# Patient Record
Sex: Female | Born: 1980
Health system: Southern US, Community
[De-identification: ages and names within clinical notes are randomized; demographics above are authoritative.]

## PROBLEM LIST (undated history)

## (undated) DIAGNOSIS — G4733 Obstructive sleep apnea (adult) (pediatric): Secondary | ICD-10-CM

## (undated) DIAGNOSIS — T7840XA Allergy, unspecified, initial encounter: Secondary | ICD-10-CM

## (undated) DIAGNOSIS — R112 Nausea with vomiting, unspecified: Secondary | ICD-10-CM

## (undated) DIAGNOSIS — Z9989 Dependence on other enabling machines and devices: Secondary | ICD-10-CM

## (undated) DIAGNOSIS — E282 Polycystic ovarian syndrome: Secondary | ICD-10-CM

## (undated) DIAGNOSIS — Z8669 Personal history of other diseases of the nervous system and sense organs: Secondary | ICD-10-CM

## (undated) DIAGNOSIS — Z9889 Other specified postprocedural states: Secondary | ICD-10-CM

## (undated) DIAGNOSIS — R7303 Prediabetes: Secondary | ICD-10-CM

## (undated) DIAGNOSIS — I10 Essential (primary) hypertension: Secondary | ICD-10-CM

## (undated) DIAGNOSIS — R011 Cardiac murmur, unspecified: Secondary | ICD-10-CM

## (undated) DIAGNOSIS — L509 Urticaria, unspecified: Secondary | ICD-10-CM

## (undated) DIAGNOSIS — I209 Angina pectoris, unspecified: Secondary | ICD-10-CM

## (undated) DIAGNOSIS — T8859XA Other complications of anesthesia, initial encounter: Secondary | ICD-10-CM

## (undated) DIAGNOSIS — R519 Headache, unspecified: Secondary | ICD-10-CM

## (undated) HISTORY — DX: Personal history of other diseases of the nervous system and sense organs: Z86.69

## (undated) HISTORY — DX: Allergy, unspecified, initial encounter: T78.40XA

## (undated) HISTORY — DX: Polycystic ovarian syndrome: E28.2

## (undated) HISTORY — PX: UVULOPALATOPHARYNGOPLASTY: SHX827

## (undated) HISTORY — DX: Obstructive sleep apnea (adult) (pediatric): Z99.89

## (undated) HISTORY — PX: TONSILLECTOMY: SUR1361

## (undated) HISTORY — DX: Obstructive sleep apnea (adult) (pediatric): G47.33

## (undated) HISTORY — DX: Essential (primary) hypertension: I10

## (undated) HISTORY — DX: Urticaria, unspecified: L50.9

## (undated) HISTORY — DX: Prediabetes: R73.03

---

## 2010-10-26 ENCOUNTER — Emergency Department (HOSPITAL_BASED_OUTPATIENT_CLINIC_OR_DEPARTMENT_OTHER)
Admission: EM | Admit: 2010-10-26 | Discharge: 2010-10-26 | Disposition: A | Discharge status: Home / Self Care | Payer: Self-pay | Attending: Emergency Medicine | Admitting: Emergency Medicine

## 2010-10-26 ENCOUNTER — Encounter: Payer: Self-pay | Admitting: Emergency Medicine

## 2010-10-26 DIAGNOSIS — R1032 Left lower quadrant pain: Secondary | ICD-10-CM | POA: Insufficient documentation

## 2010-10-26 DIAGNOSIS — K219 Gastro-esophageal reflux disease without esophagitis: Secondary | ICD-10-CM | POA: Insufficient documentation

## 2010-10-26 MED ORDER — FAMOTIDINE 20 MG PO TABS
40.0000 mg | ORAL_TABLET | Freq: Once | ORAL | Status: AC
  Administered 2010-10-26: 40 mg via ORAL
  Filled 2010-10-26: qty 2

## 2010-10-26 MED ORDER — GI COCKTAIL ~~LOC~~
30.0000 mL | Freq: Once | ORAL | Status: AC
  Administered 2010-10-26: 30 mL via ORAL
  Filled 2010-10-26: qty 30

## 2010-10-26 MED ORDER — FAMOTIDINE 20 MG PO TABS
ORAL_TABLET | ORAL | Status: AC
  Filled 2010-10-26: qty 1

## 2010-10-26 MED ORDER — SUCRALFATE 1 G PO TABS: 1.0000 g | ORAL_TABLET | Freq: Four times a day (QID) | ORAL | Status: DC

## 2010-10-26 NOTE — ED Notes (Signed)
Pt c/o LUQ abd pain with nausea after eating fried chicken tonight. Pt reports previous hx of "gallbladder problems". Gallbladder remains intact.

## 2010-10-26 NOTE — ED Provider Notes (Signed)
History     Chief Complaint  Patient presents with  . Abdominal Pain   Patient is a 30 y.o. female presenting with abdominal pain. The history is provided by the patient.  Abdominal Pain The primary symptoms of the illness include abdominal pain. The primary symptoms of the illness do not include vaginal discharge or vaginal bleeding. The current episode started less than 1 hour ago. The onset of the illness was sudden. The problem has not changed since onset. The abdominal pain is located in the LUQ. The abdominal pain radiates to the epigastric region. The abdominal pain is exacerbated by fatty foods.  The illness is associated with awakening from sleep. The patient states that she believes she is currently not pregnant.   Pt ate fired chicken and had sx shortly afterwards, no emesis or fever No past medical history on file.  No past surgical history on file.  No family history on file.  History  Substance Use Topics  . Smoking status: Never Smoker   . Smokeless tobacco: Not on file  . Alcohol Use: No    OB History    Grav Para Term Preterm Abortions TAB SAB Ect Mult Living                  Review of Systems  Gastrointestinal: Positive for abdominal pain.  Genitourinary: Negative for vaginal bleeding and vaginal discharge.  All other systems reviewed and are negative.    Physical Exam  BP 138/78  Pulse 72  Temp(Src) 98.4 F (36.9 C) (Oral)  Resp 18  SpO2 100%  Physical Exam  Nursing note and vitals reviewed. Constitutional: She is oriented to person, place, and time. Vital signs are normal. She appears well-developed and well-nourished.  Non-toxic appearance.  HENT:  Head: Normocephalic and atraumatic.  Eyes: Conjunctivae are normal. Pupils are equal, round, and reactive to light.  Neck: Normal range of motion.  Cardiovascular: Normal rate.   Pulmonary/Chest: Effort normal.  Abdominal: She exhibits no distension. There is no splenomegaly. There is tenderness  in the left upper quadrant. There is no rigidity, no rebound and no guarding. No hernia.    Neurological: She is alert and oriented to person, place, and time.  Skin: Skin is warm and dry.  Psychiatric: She has a normal mood and affect.    ED Course  Procedures  MDM  Current sx c/w gerd, meds given    Pt feels better, will give gi referral  Toy Baker, MD 10/26/10 [phone]

## 2011-05-13 DIAGNOSIS — J309 Allergic rhinitis, unspecified: Secondary | ICD-10-CM | POA: Insufficient documentation

## 2011-05-13 DIAGNOSIS — J3089 Other allergic rhinitis: Secondary | ICD-10-CM | POA: Insufficient documentation

## 2011-07-25 ENCOUNTER — Encounter (HOSPITAL_BASED_OUTPATIENT_CLINIC_OR_DEPARTMENT_OTHER): Payer: Self-pay | Admitting: *Deleted

## 2011-07-25 ENCOUNTER — Emergency Department (HOSPITAL_BASED_OUTPATIENT_CLINIC_OR_DEPARTMENT_OTHER)
Admission: EM | Admit: 2011-07-25 | Discharge: 2011-07-25 | Disposition: A | Discharge status: Home / Self Care | Payer: BC Managed Care – PPO | Attending: Emergency Medicine | Admitting: Emergency Medicine

## 2011-07-25 DIAGNOSIS — R112 Nausea with vomiting, unspecified: Secondary | ICD-10-CM | POA: Insufficient documentation

## 2011-07-25 DIAGNOSIS — K529 Noninfective gastroenteritis and colitis, unspecified: Secondary | ICD-10-CM

## 2011-07-25 DIAGNOSIS — K5289 Other specified noninfective gastroenteritis and colitis: Secondary | ICD-10-CM | POA: Insufficient documentation

## 2011-07-25 DIAGNOSIS — R109 Unspecified abdominal pain: Secondary | ICD-10-CM | POA: Insufficient documentation

## 2011-07-25 DIAGNOSIS — J45909 Unspecified asthma, uncomplicated: Secondary | ICD-10-CM | POA: Insufficient documentation

## 2011-07-25 DIAGNOSIS — R197 Diarrhea, unspecified: Secondary | ICD-10-CM | POA: Insufficient documentation

## 2011-07-25 MED ORDER — ONDANSETRON 8 MG PO TBDP: 8.0000 mg | ORAL_TABLET | Freq: Three times a day (TID) | ORAL | Status: AC | PRN

## 2011-07-25 MED ORDER — DIPHENOXYLATE-ATROPINE 2.5-0.025 MG PO TABS: 1.0000 | ORAL_TABLET | Freq: Four times a day (QID) | ORAL | Status: AC | PRN

## 2011-07-25 MED ORDER — ONDANSETRON HCL 4 MG/2ML IJ SOLN
4.0000 mg | Freq: Once | INTRAMUSCULAR | Status: AC
  Administered 2011-07-25: 4 mg via INTRAVENOUS

## 2011-07-25 MED ORDER — SODIUM CHLORIDE 0.9 % IV BOLUS (SEPSIS): 1000.0000 mL | Freq: Once | INTRAVENOUS | Status: DC

## 2011-07-25 MED ORDER — PROMETHAZINE HCL 25 MG/ML IJ SOLN
25.0000 mg | Freq: Once | INTRAMUSCULAR | Status: AC
  Administered 2011-07-25: 25 mg via INTRAVENOUS
  Filled 2011-07-25: qty 1

## 2011-07-25 MED ORDER — ONDANSETRON HCL 4 MG/2ML IJ SOLN
INTRAMUSCULAR | Status: AC
  Filled 2011-07-25: qty 2

## 2011-07-25 MED ORDER — DIPHENOXYLATE-ATROPINE 2.5-0.025 MG PO TABS
2.0000 | ORAL_TABLET | Freq: Once | ORAL | Status: AC
Start: 2011-07-25 — End: 2011-07-25
  Administered 2011-07-25: 2 via ORAL
  Filled 2011-07-25: qty 2

## 2011-07-25 NOTE — ED Notes (Signed)
Pt reports that yesterday evening she began having abdominal pain, n/v and diarrhea.  Pt reports that she has had multiple episodes of emesis and diarrhea since eating dinner@10 .

## 2011-07-25 NOTE — ED Provider Notes (Signed)
History     CSN: [id number]  Arrival date & time 07/25/11  0137   None     Chief Complaint  Patient presents with  . Nausea, vomiting and diarrhea     (Consider location/radiation/quality/duration/timing/severity/associated sxs/prior treatment) HPI This is a 31 year old black female who's been having nausea, vomiting and diarrhea since about 10 PM yesterday evening. Symptoms are described as moderate to severe. She has had intermittent abdominal cramping. She was given IV fluids on arrival but has not been given anything for nausea. She states she has a dry mouth but denies lightheadedness or fever. She also has general malaise.  Past Medical History  Diagnosis Date  . Asthma     History reviewed. No pertinent past surgical history.  No family history on file.  History  Substance Use Topics  . Smoking status: Never Smoker   . Smokeless tobacco: Not on file  . Alcohol Use: No    OB History    Grav Para Term Preterm Abortions TAB SAB Ect Mult Living                  Review of Systems  All other systems reviewed and are negative.    Allergies  Clindamycin/lincomycin and Latex  Home Medications   Current Outpatient Rx  Name Route Sig Dispense Refill  . ESOMEPRAZOLE MAGNESIUM 10 MG PO PACK Oral Take 10 mg by mouth daily before breakfast.    . RANITIDINE HCL 150 MG PO TABS Oral Take 150 mg by mouth 2 (two) times daily.    . SUCRALFATE 1 G PO TABS Oral Take 1 tablet (1 g total) by mouth 4 (four) times daily. 30 tablet 0    BP 131/88  Pulse 100  Temp(Src) 98.2 F (36.8 C) (Oral)  Resp 16  Ht 5\' 2"  (1.575 m)  Wt 200 lb (90.719 kg)  BMI 36.58 kg/m2  SpO2 98%  LMP 06/24/2011  Physical Exam General: Well-developed, well-nourished female in no acute distress; appearance consistent with age of record HENT: normocephalic, atraumatic Eyes: pupils equal round and reactive to light; extraocular muscles intact Neck: supple Heart: regular rate and rhythm Lungs:  clear to auscultation bilaterally Abdomen: soft; nondistended; nontender; bowel sounds present Extremities: No deformity; full range of motion Neurologic: Awake, alert and oriented; motor function intact in all extremities and symmetric; no facial droop Skin: Warm and dry Psychiatric: Flat affect    ED Course  Procedures (including critical care time)    MDM  6:29 AM Drinking fluids without emesis. Given a 2 L normal saline bolus.          Hanley Seamen, MD 07/25/11 [phone]

## 2011-12-11 DIAGNOSIS — I10 Essential (primary) hypertension: Secondary | ICD-10-CM | POA: Insufficient documentation

## 2013-04-30 DIAGNOSIS — E282 Polycystic ovarian syndrome: Secondary | ICD-10-CM | POA: Insufficient documentation

## 2013-06-01 DIAGNOSIS — R9431 Abnormal electrocardiogram [ECG] [EKG]: Secondary | ICD-10-CM | POA: Insufficient documentation

## 2013-06-01 DIAGNOSIS — R002 Palpitations: Secondary | ICD-10-CM | POA: Insufficient documentation

## 2013-06-01 DIAGNOSIS — I119 Hypertensive heart disease without heart failure: Secondary | ICD-10-CM | POA: Insufficient documentation

## 2014-01-16 DIAGNOSIS — L219 Seborrheic dermatitis, unspecified: Secondary | ICD-10-CM | POA: Insufficient documentation

## 2015-04-24 DIAGNOSIS — J302 Other seasonal allergic rhinitis: Secondary | ICD-10-CM | POA: Insufficient documentation

## 2015-04-24 DIAGNOSIS — J45909 Unspecified asthma, uncomplicated: Secondary | ICD-10-CM | POA: Insufficient documentation

## 2015-06-27 DIAGNOSIS — M940 Chondrocostal junction syndrome [Tietze]: Secondary | ICD-10-CM | POA: Insufficient documentation

## 2016-04-09 DIAGNOSIS — E785 Hyperlipidemia, unspecified: Secondary | ICD-10-CM | POA: Insufficient documentation

## 2016-11-11 DIAGNOSIS — G4733 Obstructive sleep apnea (adult) (pediatric): Secondary | ICD-10-CM | POA: Insufficient documentation

## 2017-01-16 DIAGNOSIS — K5730 Diverticulosis of large intestine without perforation or abscess without bleeding: Secondary | ICD-10-CM | POA: Insufficient documentation

## 2017-03-18 DIAGNOSIS — F419 Anxiety disorder, unspecified: Secondary | ICD-10-CM | POA: Insufficient documentation

## 2017-04-07 HISTORY — PX: TONSILLECTOMY: SUR1361

## 2017-04-07 HISTORY — PX: UVULOPALATOPHARYNGOPLASTY: SHX827

## 2018-04-22 DIAGNOSIS — R350 Frequency of micturition: Secondary | ICD-10-CM | POA: Insufficient documentation

## 2018-06-04 DIAGNOSIS — S139XXA Sprain of joints and ligaments of unspecified parts of neck, initial encounter: Secondary | ICD-10-CM

## 2018-06-04 DIAGNOSIS — R0602 Shortness of breath: Secondary | ICD-10-CM | POA: Insufficient documentation

## 2018-06-04 DIAGNOSIS — M519 Unspecified thoracic, thoracolumbar and lumbosacral intervertebral disc disorder: Secondary | ICD-10-CM | POA: Insufficient documentation

## 2018-06-04 HISTORY — DX: Sprain of joints and ligaments of unspecified parts of neck, initial encounter: S13.9XXA

## 2018-06-15 DIAGNOSIS — R221 Localized swelling, mass and lump, neck: Secondary | ICD-10-CM | POA: Insufficient documentation

## 2020-04-07 HISTORY — PX: COLONOSCOPY: SHX174

## 2020-05-17 ENCOUNTER — Encounter: Payer: Self-pay | Admitting: Emergency Medicine

## 2020-05-17 ENCOUNTER — Emergency Department
Admission: EM | Admit: 2020-05-17 | Discharge: 2020-05-17 | Disposition: A | Discharge status: Home / Self Care | Payer: Self-pay | Source: Home / Self Care | Attending: Family Medicine | Admitting: Family Medicine

## 2020-05-17 ENCOUNTER — Emergency Department (INDEPENDENT_AMBULATORY_CARE_PROVIDER_SITE_OTHER): Payer: Self-pay

## 2020-05-17 ENCOUNTER — Telehealth: Payer: Self-pay | Admitting: Emergency Medicine

## 2020-05-17 ENCOUNTER — Other Ambulatory Visit: Payer: Self-pay

## 2020-05-17 DIAGNOSIS — R0781 Pleurodynia: Secondary | ICD-10-CM

## 2020-05-17 LAB — POCT URINALYSIS DIP (MANUAL ENTRY)
Bilirubin, UA: NEGATIVE
Blood, UA: NEGATIVE
Glucose, UA: NEGATIVE mg/dL
Leukocytes, UA: NEGATIVE
Nitrite, UA: NEGATIVE
Protein Ur, POC: NEGATIVE mg/dL
Spec Grav, UA: 1.02 (ref 1.010–1.025)
Urobilinogen, UA: 0.2 E.U./dL
pH, UA: 5.5 (ref 5.0–8.0)

## 2020-05-17 MED ORDER — METHOCARBAMOL 750 MG PO TABS: ORAL_TABLET | ORAL | 1 refills | Status: DC

## 2020-05-17 MED ORDER — CYCLOBENZAPRINE HCL 10 MG PO TABS: ORAL_TABLET | ORAL | 1 refills | Status: DC

## 2020-05-17 NOTE — Discharge Instructions (Addendum)
Call if rash develops.  If symptoms become significantly worse during the night or over the weekend, proceed to the local emergency room.

## 2020-05-17 NOTE — ED Triage Notes (Signed)
R flank pain/R lower back pain x 2 weeks  Pt did not take her BP meds last night  No OTC meds for pain - does not like to take medicine  Describes back pain as stabbing & intermittent Pt sneezed last night and had sharp increase in pain  No COVID vaccine

## 2020-05-17 NOTE — ED Provider Notes (Signed)
[facility]    CSN: [id number] Arrival date & time: 05/17/20  1601      History   Chief Complaint Chief Complaint  Patient presents with  . Flank Pain    right    HPI Jessica Powell is a 40 y.o. female.   Two days ago patient developed pain over her right posterior costal margin, distinctly different from her chronic lower back pain.  The pain is sharp/stabbing, worse if she sneezes or coughs (although she does not have a cough).  She denies shortness of breath, injury, fever, and recent URI.  She admits that she has had fatigue and intermittent chills during the past two days.  She also notes that she has had some urinary urgency without dysuria, and denies history of kidney stones.  She denies GI symptoms.  The history is provided by the patient.    Past Medical History:  Diagnosis Date  . Asthma     Patient Active Problem List   Diagnosis Date Noted  . Localized swelling, mass and lump, neck 06/15/2018  . Intervertebral disc disorder 06/04/2018  . Neck sprain 06/04/2018  . Shortness of breath 06/04/2018  . Frequency of micturition 04/22/2018  . Anxiety disorder 03/18/2017  . Diverticula of intestine 01/16/2017  . Obstructive sleep apnea (adult) (pediatric) 11/11/2016  . Hyperlipidemia 04/09/2016  . 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 Asthma without status asthmaticus 04/24/2015  . Seasonal allergies 04/24/2015  . Seborrheic dermatitis 01/16/2014  . Polycystic ovaries 04/30/2013  . Essential hypertension 12/11/2011  . Allergic rhinitis 05/13/2011    Past Surgical History:  Procedure Laterality Date  . TONSILLECTOMY    . UVULOPALATOPHARYNGOPLASTY      OB History   No obstetric history on file.      Home Medications    Prior to Admission medications   Medication Sig Start Date End Date Taking? Authorizing Provider  budesonide-formoterol [facility]) 160-4.5 MCG/ACT inhaler 2 puffs 01/25/19 06/13/20 Yes [provider]  CANDESARTAN  CILEXETIL-HCTZ PO Take by mouth.   Yes [provider]  methocarbamol (ROBAXIN-750) 750 MG tablet Take one tab PO TID to QID prn 05/17/20  Yes Dale Strausser, Tera Mater, MD  albuterol (VENTOLIN HFA) 108 (90 Base) MCG/ACT inhaler Inhale into the lungs.    [provider]  ascorbic acid (VITAMIN C) 500 MG tablet Take by mouth.    [provider]  cetirizine (ZYRTEC) 10 MG tablet 1 tablet    [provider]  esomeprazole (NEXIUM) 10 MG packet Take 10 mg by mouth daily before breakfast. Patient not taking: Reported on 05/17/2020    [provider]  montelukast (SINGULAIR) 10 MG tablet 1 tablet    [provider]  ranitidine (ZANTAC) 150 MG tablet Take 150 mg by mouth 2 (two) times daily. Patient not taking: Reported on 05/17/2020    [provider]  sucralfate (CARAFATE) 1 G tablet Take 1 tablet (1 g total) by mouth 4 (four) times daily. Patient not taking: Reported on 05/17/2020 10/26/10 10/26/11  Lorre Nick, MD    Family History Family History  Problem Relation Age of Onset  . Thyroid disease Mother   . Healthy Sister   . Hypertension Brother   . Healthy Brother     Social History Social History   Tobacco Use  . Smoking status: Never Smoker  . Smokeless tobacco: Never Used  Vaping Use  . Vaping Use: Never used  Substance Use Topics  . Alcohol use: No  . Drug use: No  Allergies   Mushroom extract complex, Shrimp (diagnostic), Clindamycin/lincomycin, Labetalol, Latex, Lisinopril, Magnesium, and Sumatriptan   Review of Systems Review of Systems No sore throat No cough ? pleuritic pain No wheezing No nasal congestion No post-nasal drainage No sinus pain/pressure No itchy/red eyes No earache No hemoptysis No SOB No fever/ + chills No nausea No vomiting No abdominal pain No diarrhea + urinary urgency No skin rash + fatigue No myalgias No headache   Physical Exam Triage Vital Signs ED Triage Vitals  Enc  Vitals Group     BP 05/17/20 1621 108/73     Pulse Rate 05/17/20 1621 93     Resp 05/17/20 1621 17     Temp 05/17/20 1621 98 F (36.7 C)     Temp Source 05/17/20 1621 Axillary     SpO2 05/17/20 1621 97 %     Weight 05/17/20 1625 208 lb (94.3 kg)     Height 05/17/20 1625 5\' 2"  (1.575 m)     Head Circumference --      Peak Flow --      Pain Score 05/17/20 1624 4     Pain Loc --      Pain Edu? --      Excl. in GC? --    No data found.  Updated Vital Signs BP 108/73 (BP Location: Right Arm)   Pulse 93   Temp 98 F (36.7 C) (Axillary) Comment: refused oral  Resp 17   Ht 5\' 2"  (1.575 m)   Wt 94.3 kg   LMP 04/26/2020 (Approximate) Comment: PCOS  SpO2 97%   BMI 38.04 kg/m   Visual Acuity Right Eye Distance:   Left Eye Distance:   Bilateral Distance:    Right Eye Near:   Left Eye Near:    Bilateral Near:     Physical Exam Vitals and nursing note reviewed.  Constitutional:      General: She is not in acute distress.    Appearance: She is obese.  HENT:     Head: Normocephalic.     Right Ear: External ear normal.     Left Ear: External ear normal.     Nose: Nose normal.     Mouth/Throat:     Mouth: Mucous membranes are moist.     Pharynx: Oropharynx is clear.  Eyes:     Conjunctiva/sclera: Conjunctivae normal.     Pupils: Pupils are equal, round, and reactive to light.  Cardiovascular:     Rate and Rhythm: Normal rate and regular rhythm.     Heart sounds: Normal heart sounds.  Pulmonary:     Breath sounds: Normal breath sounds.    Chest:     Chest wall: Tenderness present. No crepitus.       Comments: There is tenderness to palpation along the right costal margin extending posteriorly as noted on diagram.  Abdominal:     Palpations: Abdomen is soft.     Comments: There is tenderness to palpation just beneath the right costal margin, but the tenderness is primarily along the lower rib.  Musculoskeletal:        General: No swelling or tenderness.      Cervical back: Neck supple.     Right lower leg: No edema.     Left lower leg: No edema.  Lymphadenopathy:     Cervical: No cervical adenopathy.  Skin:    General: Skin is warm and dry.     Findings: No rash.  Neurological:     Mental  Status: She is alert and oriented to person, place, and time.      UC Treatments / Results  Labs (all labs ordered are listed, but only abnormal results are displayed) Labs Reviewed  POCT URINALYSIS DIP (MANUAL ENTRY) - Abnormal; Notable for the following components:      Result Value   Clarity, UA clear (*)    Ketones, POC UA trace (5) (*)    All other components within normal limits  CBC WITH DIFFERENTIAL/PLATELET  COMPLETE METABOLIC PANEL WITH GFR    EKG   Radiology DG Ribs Unilateral W/Chest Right  Result Date: 05/17/2020 CLINICAL DATA:  Pain in right inferior ribs x2 weeks EXAM: RIGHT RIBS AND CHEST - 3+ VIEW COMPARISON:  None. FINDINGS: No fracture or other bone lesions are seen involving the ribs. There is no evidence of pneumothorax or pleural effusion. Both lungs are clear. Heart size and mediastinal contours are within normal limits. IMPRESSION: Negative. Electronically Signed   By: Maudry Mayhew MD   On: 05/17/2020 17:24    Procedures Procedures (including critical care time)  Medications Ordered in UC Medications - No data to display  Initial Impression / Assessment and Plan / UC Course  I have reviewed the triage vital signs and the nursing notes.  Pertinent labs & imaging results that were available during my care of the patient were reviewed by me and considered in my medical decision making (see chart for details).    Negative rib/chest x-rays and unremarkable urinalysis reassuring. ?early shingles; ?costochondritis.   Call if rash develops (begin Valtrex) CBC and CMP pending. Begin Robaxin (at patient's request).    Final Clinical Impressions(s) / UC Diagnoses   Final diagnoses:  Rib pain on right side      Discharge Instructions     Call if rash develops.  If symptoms become significantly worse during the night or over the weekend, proceed to the local emergency room.       ED Prescriptions    Medication Sig Dispense Auth. Provider   cyclobenzaprine (FLEXERIL) 10 MG tablet  (Status: Discontinued) Take one tab PO 2 to 3 times daily as needed. 15 tablet Lattie Haw, MD   methocarbamol (ROBAXIN-750) 750 MG tablet Take one tab PO TID to QID prn 20 tablet Lattie Haw, MD        Lattie Haw, MD 05/18/20 [phone]

## 2020-05-17 NOTE — Telephone Encounter (Signed)
Call to pharmacist to cancel cyclobenzaprine per pt request & MD verbal order. Script changed to robaxin - see AVS.

## 2020-05-17 NOTE — ED Notes (Signed)
Pt's meds for home changed to robaxin -called to pharmacy - see telephone note - pt left prior to med change because she did not want to wait.

## 2020-05-18 LAB — CBC WITH DIFFERENTIAL/PLATELET
Absolute Monocytes: 507 cells/uL (ref 200–950)
Basophils Absolute: 53 cells/uL (ref 0–200)
Basophils Relative: 0.6 %
Eosinophils Absolute: 107 cells/uL (ref 15–500)
Eosinophils Relative: 1.2 %
HCT: 37.3 % (ref 35.0–45.0)
Hemoglobin: 12.6 g/dL (ref 11.7–15.5)
Lymphs Abs: 3035 cells/uL (ref 850–3900)
MCH: 28.9 pg (ref 27.0–33.0)
MCHC: 33.8 g/dL (ref 32.0–36.0)
MCV: 85.6 fL (ref 80.0–100.0)
MPV: 11.1 fL (ref 7.5–12.5)
Monocytes Relative: 5.7 %
Neutro Abs: 5198 cells/uL (ref 1500–7800)
Neutrophils Relative %: 58.4 %
Platelets: 311 10*3/uL (ref 140–400)
RBC: 4.36 10*6/uL (ref 3.80–5.10)
RDW: 14.6 % (ref 11.0–15.0)
Total Lymphocyte: 34.1 %
WBC: 8.9 10*3/uL (ref 3.8–10.8)

## 2020-05-18 LAB — COMPLETE METABOLIC PANEL WITH GFR
AG Ratio: 1.1 (calc) (ref 1.0–2.5)
ALT: 18 U/L (ref 6–29)
AST: 15 U/L (ref 10–30)
Albumin: 4 g/dL (ref 3.6–5.1)
Alkaline phosphatase (APISO): 57 U/L (ref 31–125)
BUN: 16 mg/dL (ref 7–25)
CO2: 29 mmol/L (ref 20–32)
Calcium: 9.6 mg/dL (ref 8.6–10.2)
Chloride: 101 mmol/L (ref 98–110)
Creat: 0.85 mg/dL (ref 0.50–1.10)
GFR, Est African American: 101 mL/min/{1.73_m2} (ref >=60)
GFR, Est Non African American: 87 mL/min/{1.73_m2} (ref >=60)
Globulin: 3.5 g/dL (calc) (ref 1.9–3.7)
Glucose, Bld: 96 mg/dL (ref 65–99)
Potassium: 4 mmol/L (ref 3.5–5.3)
Sodium: 137 mmol/L (ref 135–146)
Total Bilirubin: 0.5 mg/dL (ref 0.2–1.2)
Total Protein: 7.5 g/dL (ref 6.1–8.1)

## 2020-07-05 DIAGNOSIS — M25471 Effusion, right ankle: Secondary | ICD-10-CM | POA: Insufficient documentation

## 2020-07-05 DIAGNOSIS — B351 Tinea unguium: Secondary | ICD-10-CM | POA: Insufficient documentation

## 2020-10-04 DIAGNOSIS — M5412 Radiculopathy, cervical region: Secondary | ICD-10-CM | POA: Insufficient documentation

## 2020-11-09 ENCOUNTER — Ambulatory Visit: Payer: Self-pay | Admitting: Family Medicine

## 2020-11-15 ENCOUNTER — Ambulatory Visit: Payer: No Typology Code available for payment source | Admitting: Family Medicine

## 2020-11-29 ENCOUNTER — Ambulatory Visit (INDEPENDENT_AMBULATORY_CARE_PROVIDER_SITE_OTHER): Payer: No Typology Code available for payment source | Admitting: Family Medicine

## 2020-11-29 ENCOUNTER — Encounter: Payer: Self-pay | Admitting: Family Medicine

## 2020-11-29 ENCOUNTER — Other Ambulatory Visit: Payer: Self-pay

## 2020-11-29 VITALS — BP 112/76 | HR 73 | Ht 62.0 in | Wt 212.0 lb

## 2020-11-29 DIAGNOSIS — T782XXA Anaphylactic shock, unspecified, initial encounter: Secondary | ICD-10-CM | POA: Insufficient documentation

## 2020-11-29 DIAGNOSIS — T782XXD Anaphylactic shock, unspecified, subsequent encounter: Secondary | ICD-10-CM

## 2020-11-29 DIAGNOSIS — R0989 Other specified symptoms and signs involving the circulatory and respiratory systems: Secondary | ICD-10-CM | POA: Insufficient documentation

## 2020-11-29 DIAGNOSIS — J45909 Unspecified asthma, uncomplicated: Secondary | ICD-10-CM

## 2020-11-29 DIAGNOSIS — I517 Cardiomegaly: Secondary | ICD-10-CM | POA: Insufficient documentation

## 2020-11-29 DIAGNOSIS — R3915 Urgency of urination: Secondary | ICD-10-CM

## 2020-11-29 DIAGNOSIS — M519 Unspecified thoracic, thoracolumbar and lumbosacral intervertebral disc disorder: Secondary | ICD-10-CM

## 2020-11-29 DIAGNOSIS — R519 Headache, unspecified: Secondary | ICD-10-CM

## 2020-11-29 DIAGNOSIS — M5412 Radiculopathy, cervical region: Secondary | ICD-10-CM

## 2020-11-29 DIAGNOSIS — R011 Cardiac murmur, unspecified: Secondary | ICD-10-CM | POA: Insufficient documentation

## 2020-11-29 DIAGNOSIS — R350 Frequency of micturition: Secondary | ICD-10-CM | POA: Diagnosis not present

## 2020-11-29 DIAGNOSIS — G8929 Other chronic pain: Secondary | ICD-10-CM

## 2020-11-29 DIAGNOSIS — G4733 Obstructive sleep apnea (adult) (pediatric): Secondary | ICD-10-CM

## 2020-11-29 LAB — POCT URINALYSIS DIPSTICK
Bilirubin, UA: NEGATIVE
Blood, UA: NEGATIVE
Glucose, UA: NEGATIVE
Ketones, UA: NEGATIVE
Leukocytes, UA: NEGATIVE
Nitrite, UA: NEGATIVE
Protein, UA: POSITIVE — AB
Spec Grav, UA: 1.01 (ref 1.010–1.025)
Urobilinogen, UA: 0.2 E.U./dL
pH, UA: 7 (ref 5.0–8.0)

## 2020-11-29 MED ORDER — MONTELUKAST SODIUM 10 MG PO TABS: 10.0000 mg | ORAL_TABLET | Freq: Every day | ORAL | 2 refills | Status: DC

## 2020-11-29 NOTE — Assessment & Plan Note (Addendum)
This is a known issue in her cervical spine for which she was seeing an outside pain and spine group who advised medication management.  Examination today reveals prominent paraspinal muscular spasm, negative Spurling's bilaterally, diminished left elbow extension with sensorimotor otherwise intact.  Given her persistent symptomatology and findings today, I have advised further evaluation by in network pain and spine group, she is amenable to this plan and a referral was provided to her in that regard today.

## 2020-11-29 NOTE — Assessment & Plan Note (Addendum)
Right temporal/occipital, sharp in nature, aggravated by positional change, laying supine.  This is the setting of known cervical intervertebral disc disorder.  I have advised possible etiologies to include musculoskeletal/tension type headache pattern versus alternative headache diagnosis.  Referral was placed to neurology and physical therapy as well.  We will follow-up on this issue at her return.

## 2020-11-29 NOTE — Assessment & Plan Note (Signed)
Patient with multiple reported allergies, certain foods with anaphylactic component (such as shrimp).  She does have an up-to-date EpiPen, has not required use recently but had does describe shortness of breath and throat tightening upon recent exposure.  Cardiopulmonary findings benign today, given the multitude of allergies and severity of allergic response, I have advised further evaluation and management with allergy group.  A referral was placed today and patient was advised to utilize EpiPen if throat tightening noted without delay.

## 2020-11-29 NOTE — Assessment & Plan Note (Signed)
This is a chronic condition reported as stable.  Lung fields reveal clear breath sounds throughout without rales, wheezes, rhonchi.  Medications reviewed and refilled.

## 2020-11-29 NOTE — Patient Instructions (Addendum)
-   Referral coordinator will contact you in regards to scheduling visits with ENT, allergy, pain and spine, physical therapy, and neurology - Use EpiPen as discussed if need arises - Diet and activity recommendations below - Can coordinate establishment of care with OB/GYN using number below - Return for follow-up for annual physical  Diet & Exercise Recommendations Dietary changes to include reducing saturated fats, sodium, avoiding red meat, fried, processed foods, full-fat dairy, baked goods, and sweets. Incorporate more fruits, vegetables, fiber-rich foods such as whole grains, and transition to more eggs and lean meats. Make realistic changes where you can and stick to it!  Physical activity should be focused on getting 150 to 300 minutes per week of moderate to vigorous activity (30 minutes of activity at least 5 days of the week at a level of intensity where you can carry on a conversation without being out of breath). However, any increase in activity is beneficial to your health! Physical activity reduces symptoms of depression and anxiety and improves sleep quality (increase the time in deep sleep and reduce daytime sleepiness). Benefits include weight loss, improved insulin sensitivity, less adiposity, and increased bone health. Walking lowers systolic and diastolic blood pressures as well as your resting heart rate.  Cognitive benefits of exercise include short-term improvements in executive function, memory, processing speed, attention, and academic performance Increasing physical activity as we age can help [place] maintain independence by reducing cognitive decline and falls.  Please contact to schedule an appointment:  Consuella Lose [place]: [phone] Or Consuella Lose [place]: [phone]

## 2020-11-29 NOTE — Assessment & Plan Note (Signed)
Recently noted urinary frequency and urgency, urinalysis and urine culture ordered, will follow results once available.

## 2020-11-29 NOTE — Progress Notes (Signed)
Primary Care / Sports Medicine Office Visit  Patient Information:  Patient ID: Jessica Powell, female DOB: 02/06/1981 Age: 40 y.o. MRN: [id number]   Jessica Powell is a pleasant 40 y.o. 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; travel nursing   Sleep Apnea    On CPAP; diagnosed 2018; needs supplies; referral to ENT   Allergic Reaction    Would like referral to allergist due to idiopathic allergic reactions    Review of Systems pertinent details above   Patient Active Problem List   Diagnosis Date Noted   Cardiomegaly 11/29/2020   Carotid bruit 11/29/2020   Systolic murmur 11/29/2020   Idiopathic anaphylaxis 11/29/2020   Chronic right-sided headache 11/29/2020   Cervical radiculopathy 10/04/2020   Ankle swelling, right 07/05/2020   Toenail fungus 07/05/2020   Localized swelling, mass and lump, neck 06/15/2018   Intervertebral disc disorder 06/04/2018   Neck sprain 06/04/2018   Shortness of breath 06/04/2018   Urinary frequency 04/22/2018   Anxiety disorder 03/18/2017   Diverticula of intestine 01/16/2017   Obstructive sleep apnea syndrome 11/11/2016   Hyperlipidemia 04/09/2016   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   Asthma without status asthmaticus 04/24/2015   Seasonal allergies 04/24/2015   Seborrheic dermatitis 01/16/2014   Electrocardiogram abnormal 06/01/2013   Hypertensive heart disease without congestive heart failure 06/01/2013   Palpitations 06/01/2013   Polycystic ovaries 04/30/2013   Essential hypertension 12/11/2011   Allergic rhinitis 05/13/2011   Past Medical History:  Diagnosis Date   Allergy    Asthma    Hypertension    OSA on CPAP    PCOS (polycystic ovarian syndrome)    Outpatient Encounter Medications as of 11/29/2020  Medication Sig   albuterol (VENTOLIN HFA) 108 (90 Base) MCG/ACT inhaler Inhale into the lungs.    budesonide-formoterol (SYMBICORT) 160-4.5 MCG/ACT inhaler 2 puffs   cetirizine (ZYRTEC) 10 MG tablet 1 tablet   EPINEPHrine 0.3 mg/0.3 mL IJ SOAJ injection Inject 0.3 mg into the muscle once.   gabapentin (NEURONTIN) 300 MG capsule Take by mouth. 300 mg in the AM, 300 mg in the afternoon, and 600 mg at night   letrozole (FEMARA) 2.5 MG tablet Take 7.5 mg by mouth daily.   meloxicam (MOBIC) 15 MG tablet Take 15 mg by mouth daily.   methocarbamol (ROBAXIN) 500 MG tablet Take 500 mg by mouth daily as needed.   telmisartan-hydrochlorothiazide (MICARDIS HCT) 80-25 MG tablet Take 1 tablet by mouth daily.   [DISCONTINUED] montelukast (SINGULAIR) 10 MG tablet Take 10 mg by mouth at bedtime.   amLODipine (NORVASC) 5 MG tablet Take 5 mg by mouth daily. (Patient not taking: Reported on 11/29/2020)   montelukast (SINGULAIR) 10 MG tablet Take 1 tablet (10 mg total) by mouth at bedtime.   [DISCONTINUED] ascorbic acid (VITAMIN C) 500 MG tablet Take by mouth.   [DISCONTINUED] CANDESARTAN CILEXETIL-HCTZ PO Take by mouth.   [DISCONTINUED] esomeprazole (NEXIUM) 10 MG packet Take 10 mg by mouth daily before breakfast. (Patient not taking: Reported on 05/17/2020)   [DISCONTINUED] metFORMIN (GLUCOPHAGE-XR) 750 MG 24 hr tablet Take 750 mg by mouth daily.   [DISCONTINUED] methocarbamol (ROBAXIN-750) 750 MG tablet Take one tab PO TID to QID prn   [DISCONTINUED] metoprolol succinate (TOPROL-XL) 25 MG 24 hr tablet Take 25 mg by mouth daily.   [DISCONTINUED] Omega-3 Fatty Acids (FISH OIL) 1000 MG CAPS Take 1,000  mg by mouth daily.   [DISCONTINUED] pantoprazole (PROTONIX) 40 MG tablet Take 40 mg by mouth 2 (two) times daily.   [DISCONTINUED] ranitidine (ZANTAC) 150 MG tablet Take 150 mg by mouth 2 (two) times daily. (Patient not taking: Reported on 05/17/2020)   [DISCONTINUED] spironolactone (ALDACTONE) 25 MG tablet Take 25 mg by mouth daily.   [DISCONTINUED] sucralfate (CARAFATE) 1 G tablet Take 1 tablet (1 g total) by mouth  4 (four) times daily. (Patient not taking: Reported on 05/17/2020)   [DISCONTINUED] traMADol (ULTRAM) 50 MG tablet Take 50 mg by mouth every 6 (six) hours as needed.   No facility-administered encounter medications on file as of 11/29/2020.   Past Surgical History:  Procedure Laterality Date   TONSILLECTOMY     UVULOPALATOPHARYNGOPLASTY      Vitals:   11/29/20 0958  BP: 112/76  Pulse: 73  SpO2: 98%   Vitals:   11/29/20 0958  Weight: 212 lb (96.2 kg)  Height: 5\' 2"  (1.575 m)   Body mass index is 38.78 kg/m.  No results found.   Independent interpretation of notes and tests performed by another provider:   None  Procedures performed:   None  Pertinent History, Exam, Impression, and Recommendations:   Asthma without status asthmaticus This is a chronic condition reported as stable.  Lung fields reveal clear breath sounds throughout without rales, wheezes, rhonchi.  Medications reviewed and refilled.  Obstructive sleep apnea syndrome This is a chronic issue reported as stable, given patient's recent move to the area, she is interested in establishing care with local ENT/sleep medicine.  A referral was provided to her in that regard today.  Intervertebral disc disorder This is a known issue in her cervical spine for which she was seeing an outside pain and spine group who advised medication management.  Examination today reveals prominent paraspinal muscular spasm, negative Spurling's bilaterally, diminished left elbow extension with sensorimotor otherwise intact.  Given her persistent symptomatology and findings today, I have advised further evaluation by in network pain and spine group, she is amenable to this plan and a referral was provided to her in that regard today.  Urinary frequency Recently noted urinary frequency and urgency, urinalysis and urine culture ordered, will follow results once available.  Idiopathic anaphylaxis Patient with multiple reported allergies,  certain foods with anaphylactic component (such as shrimp).  She does have an up-to-date EpiPen, has not required use recently but had does describe shortness of breath and throat tightening upon recent exposure.  Cardiopulmonary findings benign today, given the multitude of allergies and severity of allergic response, I have advised further evaluation and management with allergy group.  A referral was placed today and patient was advised to utilize EpiPen if throat tightening noted without delay.  Chronic right-sided headache Right temporal/occipital, sharp in nature, aggravated by positional change, laying supine.  This is the setting of known cervical intervertebral disc disorder.  I have advised possible etiologies to include musculoskeletal/tension type headache pattern versus alternative headache diagnosis.  Referral was placed to neurology and physical therapy as well.  We will follow-up on this issue at her return.    Orders & Medications Meds ordered this encounter  Medications   montelukast (SINGULAIR) 10 MG tablet    Sig: Take 1 tablet (10 mg total) by mouth at bedtime.    Dispense:  30 tablet    Refill:  2    Orders Placed This Encounter  Procedures   Urine Culture   Ambulatory referral to Allergy  Ambulatory referral to Neurology   Ambulatory referral to ENT   Ambulatory referral to Physical Therapy   Ambulatory referral to Pain Clinic   POCT Urinalysis Dipstick    I provided a total time of 49 minutes including both face-to-face and non-face-to-face time on 11/29/2020 inclusive of time utilized for medical chart review, information gathering, care coordination with staff, and documentation completion.   Return for Annual physical.     Jerrol Banana, MD   Primary Care Sports Medicine [facility]

## 2020-11-29 NOTE — Assessment & Plan Note (Signed)
This is a chronic issue reported as stable, given patient's recent move to the area, she is interested in establishing care with local ENT/sleep medicine.  A referral was provided to her in that regard today.

## 2020-11-29 NOTE — Progress Notes (Signed)
The UA came back with protein as the only positive findings, we have sent this for culture as well, once resulted - our office will be in touch.

## 2020-11-30 ENCOUNTER — Encounter: Payer: Self-pay | Admitting: Neurology

## 2020-12-03 ENCOUNTER — Other Ambulatory Visit: Payer: Self-pay | Admitting: Family Medicine

## 2020-12-03 DIAGNOSIS — N3000 Acute cystitis without hematuria: Secondary | ICD-10-CM

## 2020-12-03 DIAGNOSIS — R350 Frequency of micturition: Secondary | ICD-10-CM

## 2020-12-03 LAB — URINE CULTURE

## 2020-12-03 MED ORDER — NITROFURANTOIN MONOHYD MACRO 100 MG PO CAPS: 100.0000 mg | ORAL_CAPSULE | Freq: Two times a day (BID) | ORAL | 0 refills | Status: DC

## 2020-12-03 NOTE — Progress Notes (Signed)
Ms. Jessica Powell, the urine culture came back showing E. Faecalis, it is nearly pan-sensitive and I have sent in Macrobid (nitrofurantoin) given your allergy profile. Please dose this for the full course and stay adequately hydrated throughout. If any yeast-like discharge noted, please contact for fluconazole. Please reach out for any questions.

## 2020-12-18 ENCOUNTER — Other Ambulatory Visit: Payer: Self-pay

## 2020-12-18 ENCOUNTER — Encounter: Payer: Self-pay | Admitting: Family Medicine

## 2020-12-18 ENCOUNTER — Ambulatory Visit: Payer: BLUE CROSS/BLUE SHIELD | Admitting: Physical Therapy

## 2020-12-18 ENCOUNTER — Ambulatory Visit (INDEPENDENT_AMBULATORY_CARE_PROVIDER_SITE_OTHER): Payer: No Typology Code available for payment source | Admitting: Family Medicine

## 2020-12-18 VITALS — BP 98/72 | HR 78 | Temp 98.2°F | Ht 62.0 in | Wt 212.0 lb

## 2020-12-18 DIAGNOSIS — R292 Abnormal reflex: Secondary | ICD-10-CM | POA: Diagnosis not present

## 2020-12-18 DIAGNOSIS — Z0000 Encounter for general adult medical examination without abnormal findings: Secondary | ICD-10-CM

## 2020-12-18 DIAGNOSIS — N3000 Acute cystitis without hematuria: Secondary | ICD-10-CM | POA: Diagnosis not present

## 2020-12-18 DIAGNOSIS — J45909 Unspecified asthma, uncomplicated: Secondary | ICD-10-CM

## 2020-12-18 DIAGNOSIS — R7989 Other specified abnormal findings of blood chemistry: Secondary | ICD-10-CM

## 2020-12-18 DIAGNOSIS — Z13220 Encounter for screening for lipoid disorders: Secondary | ICD-10-CM | POA: Diagnosis not present

## 2020-12-18 DIAGNOSIS — I10 Essential (primary) hypertension: Secondary | ICD-10-CM

## 2020-12-18 DIAGNOSIS — G8929 Other chronic pain: Secondary | ICD-10-CM

## 2020-12-18 DIAGNOSIS — G4733 Obstructive sleep apnea (adult) (pediatric): Secondary | ICD-10-CM

## 2020-12-18 DIAGNOSIS — R519 Headache, unspecified: Secondary | ICD-10-CM

## 2020-12-18 DIAGNOSIS — B350 Tinea barbae and tinea capitis: Secondary | ICD-10-CM

## 2020-12-18 DIAGNOSIS — M5412 Radiculopathy, cervical region: Secondary | ICD-10-CM

## 2020-12-18 MED ORDER — KETOCONAZOLE 2 % EX SHAM: MEDICATED_SHAMPOO | CUTANEOUS | 11 refills | Status: AC

## 2020-12-18 MED ORDER — FLUCONAZOLE 150 MG PO TABS: 150.0000 mg | ORAL_TABLET | Freq: Once | ORAL | 0 refills | Status: AC

## 2020-12-18 NOTE — Assessment & Plan Note (Signed)
Has referral with neurology upcoming, symptoms are stable for this chronic issue.

## 2020-12-18 NOTE — Assessment & Plan Note (Signed)
Chronic issue that is not adequately controlled, with current gabapentin and Robaxin through her previous pain and spine group.  She does have upcoming visit with local pain and spine in November, over the interim she has noted excessive drowsiness and clumsiness with gabapentin, has started to decrease this dose on her own.  I have discussed a structured method on limiting gabapentin to least amount necessary.  Additionally, the importance of maintaining follow-up with pain and spine for additional management options such as epidural corticosteroid injections.  Lastly, the utility of duloxetine was reviewed for her chronic musculoskeletal pain to offer symptom control until she has an opportunity to see pain and spine, she will review this and contact surgery if wishing to proceed with this medication.

## 2020-12-18 NOTE — Assessment & Plan Note (Signed)
Has noted recurrence of this previously reported issue, good response from Nizoral 2% shampoo, can follow-up on as-needed basis for this issue.

## 2020-12-18 NOTE — Assessment & Plan Note (Signed)
Chronic condition that is stable on current pharmacotherapy regimen.

## 2020-12-18 NOTE — Assessment & Plan Note (Signed)
ENT/sleep medicine referral placed at last visit, is a chronic and stable condition, patient did not hear from referral coordinator.  Contact information provided to the patient today for her to contact their group and schedule accordingly.  For any additional referrals, etc. needed from our office, she was advised to contact us.  I discussed the link between OSA and hypertension we will follow this issue peripherally.

## 2020-12-18 NOTE — Assessment & Plan Note (Signed)
Patient with stable blood pressure readings today, she has expressed desire to conceive, in that regard I did discuss the nature of her current blood pressure medication containing an ARB, she has had issues with previous treatments due to her allergy history.  Given her complex allergy history, desire to grow her family, and ongoing hypertension management needs, I have placed a referral to cardiology for additional evaluation management.  Patient is amenable to this plan and will follow up accordingly.

## 2020-12-18 NOTE — Patient Instructions (Addendum)
-   Can review information on duloxetine (Cymbalta), contact our office if interested in starting this medication - Referral coordinator will contact you in regards to follow-up with cardiology - Can contact ENT/sleep medicine with the following number: [phone] -Return for follow-up in December, contact office for any questions between now and then  - Obtain fasting labs with orders provided (can have water or black coffee but otherwise no food or drink x 8 hours before labs) - Review information provided - Attend eye doctor once per year, dentist every 6 months, work towards or maintain 30 minutes of moderate intensity physical activity, and consume a balanced diet - Contact us for any questions between now and then

## 2020-12-18 NOTE — Progress Notes (Signed)
Annual Physical Exam Visit  Patient Information:  Patient ID: Jessica Powell, female DOB: December 20, 1980 Age: 40 y.o. MRN: [id number]   Subjective:   CC: Annual Physical Exam  HPI:  Jessica Powell is here for their annual physical.  I reviewed the past medical history, family history, social history, surgical history, and allergies today and changes were made as necessary.  Please see the problem list section below for additional details.  Past Medical History: Past Medical History:  Diagnosis Date   Allergy    Asthma    Hypertension    OSA on CPAP    PCOS (polycystic ovarian syndrome)    Past Surgical History: Past Surgical History:  Procedure Laterality Date   TONSILLECTOMY     UVULOPALATOPHARYNGOPLASTY     Family History: Family History  Problem Relation Age of Onset   Thyroid disease Mother    Healthy Sister    Hypertension Brother    Healthy Brother    Allergies: Allergies  Allergen Reactions   Clindamycin/Lincomycin Hives and Swelling   Gramineae Pollens Shortness Of Breath   Influenza Vaccines Anaphylaxis   Mushroom Extract Complex Swelling   Shellfish Allergy Swelling   Shrimp (Diagnostic) Swelling   Avocado Swelling   Labetalol     Other reaction(s): neurological reaction headache   Latex Itching   Lisinopril Cough    Other reaction(s): neurological reaction headache   Magnesium Other (See Comments)    Neurological reaction   Metformin Hcl Other (See Comments)    Hypoglycemia   Other Swelling   Pineapple Swelling   Sumatriptan Other (See Comments)    Neurological reaction   Health Maintenance: Health Maintenance  Topic Date Due   HIV Screening  Never done   Hepatitis C Screening  Never done   TETANUS/TDAP  Never done   PAP SMEAR-Modifier  Never done   COVID-19 Vaccine (3 - Booster for Pfizer series) 12/13/2020   INFLUENZA VACCINE  01/15/2021 (Originally 11/05/2020)   Pneumococcal Vaccine 44-36 Years old  Aged Out   HPV VACCINES  Aged  Out    HM Colonoscopy     This patient has no relevant Health Maintenance data.      Medications: Current Outpatient Medications on File Prior to Visit  Medication Sig Dispense Refill   albuterol (VENTOLIN HFA) 108 (90 Base) MCG/ACT inhaler Inhale into the lungs.     budesonide-formoterol (SYMBICORT) 160-4.5 MCG/ACT inhaler Inhale 2 puffs into the lungs in the morning and at bedtime.     cetirizine (ZYRTEC) 10 MG tablet Take 10 mg by mouth daily.     EPINEPHrine 0.3 mg/0.3 mL IJ SOAJ injection Inject 0.3 mg into the muscle once.     gabapentin (NEURONTIN) 300 MG capsule Take 300 mg by mouth 2 (two) times daily.     letrozole (FEMARA) 2.5 MG tablet Take 7.5 mg by mouth daily.     montelukast (SINGULAIR) 10 MG tablet Take 1 tablet (10 mg total) by mouth at bedtime. 30 tablet 2   telmisartan-hydrochlorothiazide (MICARDIS HCT) 80-25 MG tablet Take 1 tablet by mouth daily.     nitrofurantoin, macrocrystal-monohydrate, (MACROBID) 100 MG capsule Take 1 capsule (100 mg total) by mouth 2 (two) times daily. (Patient not taking: Reported on 12/18/2020) 14 capsule 0   No current facility-administered medications on file prior to visit.    Review of Systems: + headache, visual changes, nausea, vomiting, diarrhea, constipation, dizziness, abdominal pain, skin rash, fevers, chills, night sweats, swollen lymph nodes, weight loss, chest pain,  body aches, joint swelling, muscle aches, shortness of breath, +neck pain, +clumsiness, mood changes, visual or auditory hallucinations reported.  Objective:   Vitals:   12/18/20 0929  BP: 98/72  Pulse: 78  Temp: 98.2 F (36.8 C)  SpO2: 98%   Vitals:   12/18/20 0929  Weight: 212 lb (96.2 kg)  Height: 5\' 2"  (1.575 m)   Body mass index is 38.78 kg/m.  General: Well Developed, well nourished, and in no acute distress.  Neuro: Alert and oriented x3, extra-ocular muscles intact, sensation grossly intact. Cranial nerves II through XII are intact, motor,  sensory, and coordinative functions are grossly intact. Absent reflexes at bilateral triceps, brachioradialis, patellar tendon, and Achilles HEENT: Normocephalic, atraumatic, neck supple, no masses, no lymphadenopathy, thyroid nonpalpable. Oropharynx findings consistent with surgical history, nasopharynx, external ear canals are unremarkable. Skin: Warm and dry, no rashes noted.  Left temporal aspect of scalp with flaking skin Cardiac: Regular rate and rhythm, no murmurs rubs or gallops. No peripheral edema. Pulses symmetric. Respiratory: Clear to auscultation bilaterally. Not using accessory muscles, speaking in full sentences.  Abdominal: Soft, nontender, nondistended, positive bowel sounds, no masses, no organomegaly. Musculoskeletal: Shoulder, elbow, wrist, hip, knee, ankle stable, and with full range of motion.  Female chaperone initials: BN present throughout the physical examination.  Impression and Recommendations:   The patient was counselled, risk factors were discussed, and anticipatory guidance given.  Essential hypertension Patient with stable blood pressure readings today, she has expressed desire to conceive, in that regard I did discuss the nature of her current blood pressure medication containing an ARB, she has had issues with previous treatments due to her allergy history.  Given her complex allergy history, desire to grow her family, and ongoing hypertension management needs, I have placed a referral to cardiology for additional evaluation management.  Patient is amenable to this plan and will follow up accordingly.  Obstructive sleep apnea syndrome ENT/sleep medicine referral placed at last visit, is a chronic and stable condition, patient did not hear from referral coordinator.  Contact information provided to the patient today for her to contact their group and schedule accordingly.  For any additional referrals, etc. needed from our office, she was advised to contact .  I  discussed the link between OSA and hypertension we will follow this issue peripherally.  Asthma without status asthmaticus Chronic condition that is stable on current pharmacotherapy regimen.  Cervical radiculopathy Chronic issue that is not adequately controlled, with current gabapentin and Robaxin through her previous pain and spine group.  She does have upcoming visit with local pain and spine in November, over the interim she has noted excessive drowsiness and clumsiness with gabapentin, has started to decrease this dose on her own.  I have discussed a structured method on limiting gabapentin to least amount necessary.  Additionally, the importance of maintaining follow-up with pain and spine for additional management options such as epidural corticosteroid injections.  Lastly, the utility of duloxetine was reviewed for her chronic musculoskeletal pain to offer symptom control until she has an opportunity to see pain and spine, she will review this and contact surgery if wishing to proceed with this medication.  Chronic right-sided headache Has referral with neurology upcoming, symptoms are stable for this chronic issue.  Tinea capitis Has noted recurrence of this previously reported issue, good response from Nizoral 2% shampoo, can follow-up on as-needed basis for this issue.  Orders & Medications Medications:  Meds ordered this encounter  Medications   fluconazole (DIFLUCAN)  150 MG tablet    Sig: Take 1 tablet (150 mg total) by mouth once for 1 dose.    Dispense:  1 tablet    Refill:  0   ketoconazole (NIZORAL) 2 % shampoo    Sig: Apply to scalp twice a week for 8 weeks and then weekly thereafter    Dispense:  120 mL    Refill:  11   Orders Placed This Encounter  Procedures   TSH Rfx on Abnormal to Free T4   B12 and Folate Panel   Iron, TIBC and Ferritin Panel   Lipid panel   Hemoglobin A1c   Comprehensive metabolic panel   CBC with Differential/Platelet   VITAMIN D 25  Hydroxy (Vit-D Deficiency, Fractures)   HIV antibody (with reflex)   Hepatitis C Antibody   Ambulatory referral to Cardiology     Return in about 3 months (around 03/19/2021) for follow-up referrals.    Jerrol Banana, MD   Primary Care Sports Medicine [facility]

## 2020-12-19 ENCOUNTER — Telehealth: Payer: Self-pay

## 2020-12-19 ENCOUNTER — Ambulatory Visit: Payer: Self-pay | Admitting: *Deleted

## 2020-12-19 LAB — COMPREHENSIVE METABOLIC PANEL
ALT: 23 IU/L (ref 0–32)
AST: 19 IU/L (ref 0–40)
Albumin/Globulin Ratio: 1.6 (ref 1.2–2.2)
Albumin: 4.6 g/dL (ref 3.8–4.8)
Alkaline Phosphatase: 62 IU/L (ref 44–121)
BUN/Creatinine Ratio: 16 (ref 9–23)
BUN: 14 mg/dL (ref 6–20)
Bilirubin Total: 0.5 mg/dL (ref 0.0–1.2)
CO2: 25 mmol/L (ref 20–29)
Calcium: 9.7 mg/dL (ref 8.7–10.2)
Chloride: 100 mmol/L (ref 96–106)
Creatinine, Ser: 0.89 mg/dL (ref 0.57–1.00)
Globulin, Total: 2.9 g/dL (ref 1.5–4.5)
Glucose: 90 mg/dL (ref 65–99)
Potassium: 4.2 mmol/L (ref 3.5–5.2)
Sodium: 138 mmol/L (ref 134–144)
Total Protein: 7.5 g/dL (ref 6.0–8.5)
eGFR: 85 mL/min/{1.73_m2} (ref >59)

## 2020-12-19 LAB — VITAMIN D 25 HYDROXY (VIT D DEFICIENCY, FRACTURES): Vit D, 25-Hydroxy: 39.1 ng/mL (ref 30.0–100.0)

## 2020-12-19 LAB — LIPID PANEL
Chol/HDL Ratio: 2.9 ratio (ref 0.0–4.4)
Cholesterol, Total: 193 mg/dL (ref 100–199)
HDL: 66 mg/dL (ref >39)
LDL Chol Calc (NIH): 113 mg/dL — ABNORMAL HIGH (ref 0–99)
Triglycerides: 77 mg/dL (ref 0–149)
VLDL Cholesterol Cal: 14 mg/dL (ref 5–40)

## 2020-12-19 LAB — CBC WITH DIFFERENTIAL/PLATELET
Basophils Absolute: 0 10*3/uL (ref 0.0–0.2)
Basos: 1 %
EOS (ABSOLUTE): 0 10*3/uL (ref 0.0–0.4)
Eos: 1 %
Hematocrit: 39.3 % (ref 34.0–46.6)
Hemoglobin: 13.4 g/dL (ref 11.1–15.9)
Immature Grans (Abs): 0 10*3/uL (ref 0.0–0.1)
Immature Granulocytes: 0 %
Lymphocytes Absolute: 2.1 10*3/uL (ref 0.7–3.1)
Lymphs: 28 %
MCH: 29.8 pg (ref 26.6–33.0)
MCHC: 34.1 g/dL (ref 31.5–35.7)
MCV: 88 fL (ref 79–97)
Monocytes Absolute: 0.5 10*3/uL (ref 0.1–0.9)
Monocytes: 6 %
Neutrophils Absolute: 4.9 10*3/uL (ref 1.4–7.0)
Neutrophils: 64 %
Platelets: 321 10*3/uL (ref 150–450)
RBC: 4.49 x10E6/uL (ref 3.77–5.28)
RDW: 13.1 % (ref 11.7–15.4)
WBC: 7.5 10*3/uL (ref 3.4–10.8)

## 2020-12-19 LAB — HIV ANTIBODY (ROUTINE TESTING W REFLEX): HIV Screen 4th Generation wRfx: NONREACTIVE

## 2020-12-19 LAB — IRON,TIBC AND FERRITIN PANEL
Ferritin: 33 ng/mL (ref 15–150)
Iron Saturation: 15 % (ref 15–55)
Iron: 56 ug/dL (ref 27–159)
Total Iron Binding Capacity: 380 ug/dL (ref 250–450)
UIBC: 324 ug/dL (ref 131–425)

## 2020-12-19 LAB — HEMOGLOBIN A1C
Est. average glucose Bld gHb Est-mCnc: 126 mg/dL
Hgb A1c MFr Bld: 6 % — ABNORMAL HIGH (ref 4.8–5.6)

## 2020-12-19 LAB — B12 AND FOLATE PANEL
Folate: 8.3 ng/mL (ref >3.0)
Vitamin B-12: 509 pg/mL (ref 232–1245)

## 2020-12-19 LAB — TSH RFX ON ABNORMAL TO FREE T4: TSH: 0.994 u[IU]/mL (ref 0.450–4.500)

## 2020-12-19 LAB — HEPATITIS C ANTIBODY: Hep C Virus Ab: <0.1 s/co ratio (ref 0.0–0.9)

## 2020-12-19 NOTE — Telephone Encounter (Signed)
Pt with history "Pinched nerve, C5-7 bulging disc."  Since June. States saw Dr. Ashley Royalty yesterday for physical "Wasn't bad then but worsening today." Reports pain at 9/10, constant at neck, radiates to left scapula, arm. Reports has been scaling back on meds x 2 days. States took her gabapentin this AM. Pain as been gradually worsening since morning. Agent secured appt tomorrow with Dr. Ashley Royalty at [phone] prior to triage. Advised ED. Pt states she will "Take a shower, more gabapentin and lie down, see if that helps." If continues will go to ED. Assured pt NT would route to practice for PCPs review.  [facility] CB# [phone]

## 2020-12-19 NOTE — Telephone Encounter (Signed)
Copied from CRM #[id number]. Topic: Medical Record Request - Other >> Dec 19, 2020  4:06 PM Johnson, Chaz E wrote: Patient Name/DOB/MRN #: Jessica Powell/ 3.12.1982/ mrn# [id number] Requestor Name/Agency: [facility] and spine clinic  Call Back #: [phone] Information Requested: Pain and spine center needs pts OV notes from this year faxed over / especially neck pain / phone# [phone]/ fax# [phone] ATTENTION CHIWANDA   Route to [facility] HIM Pool for [place] clinics. For all other clinics, route to the clinic's PEC Pool. 

## 2020-12-19 NOTE — Telephone Encounter (Signed)
Copied from CRM [id number]. Topic: Medical Record Request - Other >> Dec 19, 2020  4:06 PM Areatha Keas wrote: Patient Name/DOB/MRN #: Jessica Powell Jun 10, 1980/ mrn# [id number] Requestor Name/Agency: Pain and spine clinic  Call Back #: [phone] Information Requested: Pain and spine center needs pts OV notes from this year faxed over / especially neck pain / phone# [phone]/ fax# [phone] ATTENTION CHIWANDA   Route to [facility] HIM Pool for [place] clinics. For all other clinics, route to the clinic's PEC Pool.

## 2020-12-19 NOTE — Telephone Encounter (Signed)
Reason for Disposition  [1] SEVERE neck pain (e.g., excruciating, unable to do any normal activities) AND [2] not improved after 2 hours of pain medicine  Answer Assessment - Initial Assessment Questions 1. ONSET: "When did the pain begin?"      June, pinched nerve C5-7 2. LOCATION: "Where does it hurt?"      Neck, scapula,arm 3. PATTERN "Does the pain come and go, or has it been constant since it started?"      Constant and with spasms 4. SEVERITY: "How bad is the pain?"  (Scale 1-10; or mild, moderate, severe)   - NO PAIN (0): no pain or only slight stiffness    - MILD (1-3): doesn't interfere with normal activities    - MODERATE (4-7): interferes with normal activities or awakens from sleep    - SEVERE (8-10):  excruciating pain, unable to do any normal activities      Severe 9/10 presently.  5. RADIATION: "Does the pain go anywhere else, shoot into your arms?"     Arm, shoulder 6. CORD SYMPTOMS: "Any weakness or numbness of the arms or legs?"     no 7. CAUSE: "What do you think is causing the neck pain?"     Pinched nerve 8. NECK OVERUSE: "Any recent activities that involved turning or twisting the neck?"   no 9. OTHER SYMPTOMS: "Do you have any other symptoms?" (e.g., headache, fever, chest pain, difficulty breathing, neck swelling)    Headache  Protocols used: Neck Pain or Stiffness-A-AH

## 2020-12-20 ENCOUNTER — Emergency Department (HOSPITAL_COMMUNITY)
Admission: EM | Admit: 2020-12-20 | Discharge: 2020-12-20 | Disposition: A | Discharge status: Home / Self Care | Payer: No Typology Code available for payment source | Attending: Emergency Medicine | Admitting: Emergency Medicine

## 2020-12-20 ENCOUNTER — Emergency Department (HOSPITAL_COMMUNITY): Payer: No Typology Code available for payment source

## 2020-12-20 ENCOUNTER — Other Ambulatory Visit: Payer: Self-pay

## 2020-12-20 ENCOUNTER — Ambulatory Visit: Payer: No Typology Code available for payment source | Admitting: Family Medicine

## 2020-12-20 ENCOUNTER — Encounter: Payer: Self-pay | Admitting: Family Medicine

## 2020-12-20 DIAGNOSIS — M542 Cervicalgia: Secondary | ICD-10-CM | POA: Diagnosis not present

## 2020-12-20 DIAGNOSIS — Z79899 Other long term (current) drug therapy: Secondary | ICD-10-CM | POA: Diagnosis not present

## 2020-12-20 DIAGNOSIS — M5410 Radiculopathy, site unspecified: Secondary | ICD-10-CM

## 2020-12-20 DIAGNOSIS — I119 Hypertensive heart disease without heart failure: Secondary | ICD-10-CM | POA: Insufficient documentation

## 2020-12-20 DIAGNOSIS — Z7951 Long term (current) use of inhaled steroids: Secondary | ICD-10-CM | POA: Diagnosis not present

## 2020-12-20 DIAGNOSIS — Z91040 Latex allergy status: Secondary | ICD-10-CM | POA: Diagnosis not present

## 2020-12-20 DIAGNOSIS — J45909 Unspecified asthma, uncomplicated: Secondary | ICD-10-CM | POA: Insufficient documentation

## 2020-12-20 MED ORDER — GADOBUTROL 1 MMOL/ML IV SOLN
10.0000 mL | Freq: Once | INTRAVENOUS | Status: AC | PRN
  Administered 2020-12-20: 10 mL via INTRAVENOUS

## 2020-12-20 MED ORDER — ONDANSETRON 8 MG PO TBDP
8.0000 mg | ORAL_TABLET | Freq: Once | ORAL | Status: AC
  Administered 2020-12-20: 8 mg via ORAL
  Filled 2020-12-20: qty 1

## 2020-12-20 MED ORDER — OXYCODONE-ACETAMINOPHEN 5-325 MG PO TABS: 1.0000 | ORAL_TABLET | Freq: Four times a day (QID) | ORAL | 0 refills | Status: DC | PRN

## 2020-12-20 MED ORDER — LACTATED RINGERS IV SOLN: INTRAVENOUS | Status: DC

## 2020-12-20 MED ORDER — DEXAMETHASONE SODIUM PHOSPHATE 10 MG/ML IJ SOLN
10.0000 mg | Freq: Once | INTRAMUSCULAR | Status: AC
  Administered 2020-12-20: 10 mg via INTRAVENOUS
  Filled 2020-12-20: qty 1

## 2020-12-20 MED ORDER — MORPHINE SULFATE (PF) 4 MG/ML IV SOLN
8.0000 mg | Freq: Once | INTRAVENOUS | Status: DC
Start: 2020-12-20 — End: 2020-12-20

## 2020-12-20 MED ORDER — DIAZEPAM 2 MG PO TABS: 2.0000 mg | ORAL_TABLET | Freq: Four times a day (QID) | ORAL | 0 refills | Status: DC | PRN

## 2020-12-20 MED ORDER — PREDNISONE 10 MG (21) PO TBPK: ORAL_TABLET | Freq: Every day | ORAL | 0 refills | Status: DC

## 2020-12-20 MED ORDER — LORAZEPAM 2 MG/ML IJ SOLN
0.5000 mg | Freq: Once | INTRAMUSCULAR | Status: AC
  Administered 2020-12-20: 0.5 mg via INTRAVENOUS
  Filled 2020-12-20: qty 1

## 2020-12-20 MED ORDER — HYDROMORPHONE HCL 1 MG/ML IJ SOLN
1.0000 mg | Freq: Once | INTRAMUSCULAR | Status: AC
  Administered 2020-12-20: 0.5 mg via INTRAVENOUS
  Filled 2020-12-20: qty 1

## 2020-12-20 MED ORDER — DIAZEPAM 5 MG PO TABS: 5.0000 mg | ORAL_TABLET | Freq: Once | ORAL | Status: DC

## 2020-12-20 MED ORDER — KETOROLAC TROMETHAMINE 30 MG/ML IJ SOLN
30.0000 mg | Freq: Once | INTRAMUSCULAR | Status: AC
  Administered 2020-12-20: 30 mg via INTRAVENOUS
  Filled 2020-12-20: qty 1

## 2020-12-20 MED ORDER — HYDROCODONE-ACETAMINOPHEN 5-325 MG PO TABS: 1.0000 | ORAL_TABLET | ORAL | 0 refills | Status: DC | PRN

## 2020-12-20 NOTE — Progress Notes (Signed)
Ms. Jessica Powell, the labs came back normal with the exception of mildly elevated LDL and your A1c in the prediabetic range. At this stage the focus is on healthier lifestyle with the following recommendations:  Dietary changes to include reducing saturated fats, sodium, avoiding red meat, fried, processed foods, full-fat dairy, baked goods, and sweets. Incorporate more fruits, vegetables, fiber-rich foods such as whole grains, and transition to more eggs and lean meats.

## 2020-12-20 NOTE — ED Triage Notes (Signed)
Pt presents with c/o neck pain and left shoulder pain. Pt reports that she had a compression fracture and pinched nerve back in June and yesterday she started to have severe pain while at work. Pt reports she is on gabapentin and robaxin for the pain that did not work. Pt is tearful and pacing around the room.

## 2020-12-20 NOTE — ED Notes (Signed)
Pt d/c home per MD order. Discharge summary reviewed with pt, pt verbalizes understanding. No s/s of acute distress noted at discharge. Ambulatory off unit. Pt visitor is discharge ride home.

## 2020-12-20 NOTE — Telephone Encounter (Signed)
Please advise.  Okay to send records even though no evaluation was done?  Patient was seen at [facility] ER this morning.

## 2020-12-20 NOTE — ED Provider Notes (Signed)
[facility]-EMERGENCY DEPT Provider Note   CSN: [id number] Arrival date & time: 12/20/20  0820     History Chief Complaint  Patient presents with   Neck Pain    Jessica Powell is a 40 y.o. female.  40 year old female presents with severe neck pain that has been present for several weeks.  Diagnosed with cervical radiculopathy.  Has been on gabapentin and muscle relaxants.  States that yesterday at work she began having severe pain.  Symptoms do radiate down her left arm.  No cardiac component.  Denies any weakness but has had some paresthesias.  Pain is positional and better with arm above her head.  No other neurological features noted      Past Medical History:  Diagnosis Date   Allergy    Asthma    Hypertension    OSA on CPAP    PCOS (polycystic ovarian syndrome)     Patient Active Problem List   Diagnosis Date Noted   Tinea capitis 12/18/2020   Cardiomegaly 11/29/2020   Carotid bruit 11/29/2020   Systolic murmur 11/29/2020   Idiopathic anaphylaxis 11/29/2020   Chronic right-sided headache 11/29/2020   Cervical radiculopathy 10/04/2020   Ankle swelling, right 07/05/2020   Toenail fungus 07/05/2020   Localized swelling, mass and lump, neck 06/15/2018   Intervertebral disc disorder 06/04/2018   Neck sprain 06/04/2018   Shortness of breath 06/04/2018   Urinary frequency 04/22/2018   Anxiety disorder 03/18/2017   Diverticula of intestine 01/16/2017   Obstructive sleep apnea syndrome 11/11/2016   Hyperlipidemia 04/09/2016   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     Family History  Problem Relation Age of Onset   Thyroid disease Mother    Healthy Sister    Hypertension Brother    Healthy Brother     Social History   Tobacco Use   Smoking status: Never   Smokeless tobacco: Never  Vaping Use   Vaping Use: Never used  Substance Use Topics   Alcohol use: Not Currently   Drug use: Never    Home Medications Prior to Admission medications   Medication Sig Start Date End Date Taking? Authorizing Provider  albuterol (VENTOLIN HFA) 108 (90 Base) MCG/ACT inhaler Inhale into the lungs.    [provider]  budesonide-formoterol (SYMBICORT) 160-4.5 MCG/ACT inhaler Inhale 2 puffs into the lungs in the morning and at bedtime. 01/25/19 12/18/20  [provider]  cetirizine (ZYRTEC) 10 MG tablet Take 10 mg by mouth daily.    [provider]  EPINEPHrine 0.3 mg/0.3 mL IJ SOAJ injection Inject 0.3 mg into the muscle once. 09/03/20   [provider]  gabapentin (NEURONTIN) 300 MG capsule Take 300 mg by mouth 2 (two) times daily. 11/21/20   [provider]  ketoconazole (NIZORAL) 2 % shampoo Apply to scalp twice a week for 8 weeks and then weekly thereafter 12/18/20   Jerrol Banana, MD  letrozole [facility]) 2.5 MG tablet Take 7.5 mg by mouth daily. 11/11/20   [provider]  montelukast (SINGULAIR) 10 MG tablet Take 1 tablet (10 mg total) by mouth at bedtime. 11/29/20 02/27/21  Jerrol Banana, MD  nitrofurantoin, macrocrystal-monohydrate, (MACROBID) 100 MG capsule Take 1 capsule (100 mg total) by mouth 2 (two) times daily. Patient not taking: Reported on 12/18/2020 12/03/20   Jerrol Banana, MD  telmisartan-hydrochlorothiazide (MICARDIS HCT) 80-25 MG tablet Take 1 tablet by mouth daily. 09/03/20   [provider]    Allergies    Clindamycin/lincomycin, Gramineae pollens,  Influenza vaccines, Mushroom extract complex, Shellfish allergy, Shrimp (diagnostic), Avocado, Labetalol, Latex, Lisinopril, Magnesium, Metformin hcl, Other, Pineapple, and Sumatriptan  Review of Systems   Review of Systems  All other systems reviewed and are negative.  Physical Exam Updated Vital Signs BP (!) 138/106 (BP Location: Right Arm)   Pulse 92   Temp 98.1 F (36.7 C) (Oral)   Resp 20   Ht 1.575 m (5\' 2" )   Wt 96.2 kg   SpO2 100%   BMI 38.78 kg/m   Physical Exam Vitals and nursing note reviewed.  Constitutional:      General: She is not in acute distress.    Appearance: Normal appearance. She is well-developed. She is not toxic-appearing.  HENT:     Head: Normocephalic and atraumatic.  Eyes:     General: Lids are normal.     Conjunctiva/sclera: Conjunctivae normal.     Pupils: Pupils are equal, round, and reactive to light.  Neck:     Thyroid: No thyroid mass.     Trachea: No tracheal deviation.  Cardiovascular:     Rate and Rhythm: Normal rate and regular rhythm.     Heart sounds: Normal heart sounds. No murmur heard.   No gallop.  Pulmonary:     Effort: Pulmonary effort is normal. No respiratory distress.     Breath sounds: Normal breath sounds. No stridor. No decreased breath sounds, wheezing, rhonchi or rales.  Abdominal:     General: There is no distension.     Palpations: Abdomen is soft.     Tenderness: There is no abdominal tenderness. There is no rebound.  Musculoskeletal:        General: No tenderness. Normal range of motion.     Cervical back: Normal range of motion and neck supple.  Skin:    General: Skin is warm and dry.     Findings: No abrasion or rash.  Neurological:     General: No focal deficit present.     Mental Status: She is alert and oriented to person, place, and time. Mental status is at baseline.     GCS: GCS eye subscore is 4. GCS verbal subscore is 5. GCS motor subscore is 6.     Cranial Nerves: Cranial nerves are intact. No  cranial nerve deficit.     Sensory: No sensory deficit.     Motor: Motor function is intact.  Psychiatric:        Attention and Perception: Attention normal.        Mood and Affect: Mood is anxious.        Speech: Speech normal.        Behavior: Behavior normal.    ED Results / Procedures / Treatments   Labs (all labs ordered are listed, but only abnormal results are displayed) Labs Reviewed - No data to display  EKG None  Radiology No results found.  Procedures Procedures   Medications Ordered in ED Medications  morphine 4 MG/ML injection 8 mg (has no administration in  time range)  diazepam (VALIUM) tablet 5 mg (has no administration in time range)    ED Course  I have reviewed the triage vital signs and the nursing notes.  Pertinent labs & imaging results that were available during my care of the patient were reviewed by me and considered in my medical decision making (see chart for details).    MDM Rules/Calculators/A&P                           Patient medicated for pain here.  Had MRI of her cervical spine which did not show any evidence of disc herniation.  She is neurologically intact at this time we will give dose of Decadron.  Will prescribe course of prednisone as well as at opiate and give referral to spine surgeon on-call Final Clinical Impression(s) / ED Diagnoses Final diagnoses:  None    Rx / DC Orders ED Discharge Orders     None        Lorre Nick, MD 12/20/20 1112

## 2020-12-21 ENCOUNTER — Telehealth: Payer: Self-pay

## 2020-12-21 NOTE — Telephone Encounter (Signed)
Duplicate   KP 

## 2020-12-21 NOTE — Telephone Encounter (Signed)
See other encounter.

## 2020-12-21 NOTE — Telephone Encounter (Signed)
For your information  

## 2020-12-21 NOTE — Telephone Encounter (Signed)
Copied from CRM [id number]. Topic: Referral - Request for Referral >> Dec 21, 2020 10:40 AM Randol Kern wrote: Has patient seen PCP for this complaint? Yes.   *If NO, is insurance requiring patient see PCP for this issue before PCP can refer them? Referral for which specialty: Neurosurgery  Preferred provider/office: [facility]  Reason for referral: Muscle spasms, C5-C7 bulging disks with a pinched nerve >> Dec 21, 2020 10:47 AM Randol Kern wrote: Pt has [employer]

## 2020-12-21 NOTE — Telephone Encounter (Signed)
I have sent a message to Retta Diones to update the existing referral in patient's chart.  For your information. Please advise.

## 2020-12-21 NOTE — Telephone Encounter (Signed)
Copied from CRM [id number]. Topic: Referral - Request for Referral >> Dec 21, 2020 10:40 AM Randol Kern wrote: Has patient seen PCP for this complaint? Yes.   *If NO, is insurance requiring patient see PCP for this issue before PCP can refer them? Referral for which specialty: Neurosurgery  Preferred provider/office: [facility]  Reason for referral: Muscle spasms, C5-C7 bulging disks with a pinched nerve

## 2020-12-21 NOTE — Telephone Encounter (Signed)
Patient has [employer] manager for insurance.  Referral was updated via Retta Diones as requested.  Will write letter per Dr. Ashley Royalty approval and notify patient via MyChart.

## 2020-12-21 NOTE — Telephone Encounter (Signed)
Copied from CRM [id number]. Topic: General - Call Back - No Documentation >> Dec 21, 2020 10:42 AM Randol Kern wrote: Reason for CRM: Pt needs an out of work note, from September 15th until. She had to go to the ED because of bulging disks and a pinched nerve.  Best contact: [phone] Please upload on mychart

## 2020-12-21 NOTE — Telephone Encounter (Signed)
Copied from CRM [id number]. Topic: Referral - Status >> Dec 20, 2020  4:56 PM Pawlus, Maxine Glenn A wrote: Reason for CRM: Pt stated a referral was supposed to be sent to [facility], pt already scheduled an appt and needs the referral sent ASAP.

## 2020-12-21 NOTE — Telephone Encounter (Signed)
Patient called in still waiting on DR 's note to be uploaded on mychart for 09/15. Please call back

## 2020-12-21 NOTE — Telephone Encounter (Signed)
Are you able to update existing referral to pain management and send clinicals?

## 2020-12-24 NOTE — Telephone Encounter (Signed)
See other encounter.  Letter written.

## 2021-01-03 ENCOUNTER — Other Ambulatory Visit: Payer: Self-pay

## 2021-01-03 ENCOUNTER — Encounter: Payer: Self-pay | Admitting: Rehabilitative and Restorative Service Providers"

## 2021-01-03 ENCOUNTER — Ambulatory Visit (INDEPENDENT_AMBULATORY_CARE_PROVIDER_SITE_OTHER): Payer: No Typology Code available for payment source | Admitting: Rehabilitative and Restorative Service Providers"

## 2021-01-03 DIAGNOSIS — M542 Cervicalgia: Secondary | ICD-10-CM

## 2021-01-03 DIAGNOSIS — R293 Abnormal posture: Secondary | ICD-10-CM

## 2021-01-03 DIAGNOSIS — M6281 Muscle weakness (generalized): Secondary | ICD-10-CM | POA: Diagnosis not present

## 2021-01-03 NOTE — Therapy (Signed)
[facility] [address] [place], [place], [address] Phone: [phone]   Fax:  [phone]  Physical Therapy Evaluation  Patient Details  Name: Jessica Powell MRN: [id number] Date of Birth: 1980/06/28 Referring Provider (PT): Joseph Berkshire, MD   Encounter Date: 01/03/2021   PT End of Session - 01/03/21 1630     Visit Number 1    Number of Visits 12    Date for PT Re-Evaluation 02/14/21    Authorization Type centivo    PT Start Time 1400    PT Stop Time 1455    PT Time Calculation (min) 55 min             Past Medical History:  Diagnosis Date   Allergy    Asthma    Hypertension    OSA on CPAP    PCOS (polycystic ovarian syndrome)     Past Surgical History:  Procedure Laterality Date   TONSILLECTOMY     UVULOPALATOPHARYNGOPLASTY      There were no vitals filed for this visit.    Subjective Assessment - 01/03/21 1403     Subjective The patient notes on 09/12/20 she was at work and got L scapular pain.  Initially felt like it was a gas pain, but it did not clear.  With turning her head, she got L UE pain.  She has been seen by telehealth/urgent care, her PCP @ [facility], and then spine doctor.  She recently established care with Dr. Joseph Berkshire.  Gabapentin has helped.  She is uncomfortable today sitting and prefers to stand.  She tried PT with increased sxs over the summer.  Symptoms worsened on 9/14.    Pertinent History HTN, asthma, sleep apnea    Diagnostic tests MRI:  IMPRESSION:  Degenerative disc disease at C5-C6 and C6-C7.    Patient Stated Goals reduce pain    Currently in Pain? Yes    Pain Score 6    movement increases pain to 8/10   Pain Location Neck    Pain Orientation Lower;Left    Pain Radiating Towards L arm    Pain Onset More than a month ago    Pain Frequency Intermittent    Aggravating Factors  movement, night, laying down    Pain Relieving Factors meds                 [facility] PT Assessment - 01/03/21 1420       Assessment   Medical Diagnosis cervical radiculopathy    Referring Provider (PT) Joseph Berkshire, MD    Onset Date/Surgical Date 09/12/20    Hand Dominance Right    Prior Therapy went to PT over the summer- made sxs worse      Precautions   Precautions None      Restrictions   Weight Bearing Restrictions No      Balance Screen   Has the patient fallen in the past 6 months No    Has the patient had a decrease in activity level because of a fear of falling?  No    Is the patient reluctant to leave their home because of a fear of falling?  No      Home Tourist information centre manager residence    Living Arrangements Spouse/significant other      Prior Function   Level of Independence Independent    Vocation Full time employment    Radiation protection practitioner @ [employer]      Observation/Other  Assessments   Focus on Therapeutic Outcomes (FOTO)  3% functional status score      Sensation   Light Touch Impaired Detail   numbness L hand 1-3rd digits     ROM / Strength   AROM / PROM / Strength AROM;Strength      AROM   Overall AROM  Deficits    AROM Assessment Site Cervical    Cervical Flexion 8    Cervical Extension 8    Cervical - Right Side Bend 5    Cervical - Left Side Bend 5    Cervical - Right Rotation 20    Cervical - Left Rotation 10      Strength   Overall Strength Deficits    Overall Strength Comments Not able to asess due to high irritability of sxs today      Palpation   Spinal mobility Unable due to significant pain with palpation of soft tissue structures    Palpation comment The patient has significant increase in pain with palpationof soft tissue structures worse over L infraspinatus, L upper trap, L levator scapulae.  She has tightness noted along L biceps/deltoid region.      Special Tests   Other special tests Not able to assess today due to increased pain                         Objective measurements completed on examination: See above findings.       [facility] Adult PT Treatment/Exercise - 01/03/21 1645       Self-Care   Self-Care Other Self-Care Comments    Other Self-Care Comments  discussed and demonstrated gentle motion to tolerance of hte L UE      Exercises   Exercises Other Exercises    Other Exercises  demonstrated: pendulum, AAROM flexion/abduction      Modalities   Modalities Electrical Stimulation;Moist Heat      Moist Heat Therapy   Number Minutes Moist Heat 10 Minutes    Moist Heat Location Cervical;Shoulder      Electrical Stimulation   Electrical Stimulation Location L shoulder/cervical region    Electrical Stimulation Action interferential    Electrical Stimulation Parameters to tolerance    Electrical Stimulation Goals Pain                     PT Education - 01/03/21 1629     Education Details HEP    Person(s) Educated Patient    Methods Explanation;Demonstration    Comprehension Verbalized understanding;Returned demonstration                 PT Long Term Goals - 01/03/21 1631       PT LONG TERM GOAL #1   Title The patient will be indep with HEP for cervical stretching, postural strengthening, UE ROM.    Time 6    Period Weeks    Target Date 02/14/21      PT LONG TERM GOAL #2   Title The patient will improve FOTO functional status score from 3% up to 38%.    Time 6    Period Weeks    Target Date 02/14/21      PT LONG TERM GOAL #3   Title The patient will improve L UE AROM to reach a shelf above shoulder height.    Baseline Lupita does not tolerate any ROM (passive or active) of the L UE today    Time 6    Period  Weeks    Target Date 02/14/21      PT LONG TERM GOAL #4   Title The patient will report pain at rest 2/10.    Baseline 6/10 during subjective, but gets increased pain with minimal examination    Time 6    Period Weeks    Target Date 02/14/21      PT LONG  TERM GOAL #5   Title The patient will improve cervical AROM by 10 degrees in all planes.    Baseline Severely limited.    Time 6    Period Weeks    Target Date 02/14/21                    Plan - 01/03/21 1635     Clinical Impression Statement The patient is a 40 year old female presenting to OP physical therapy with sudden onset of neck pain that began 09/12/20 and worsened on 12/19/20.  At today's evaluation, the patient had severe pain with minimal palpation and movement and did not tolerate ROM.  PT demonstrated gentle exercises to try at home to her tolerance and provided heat/ e-stim for comfort.  The patient has impairments in AROM c-spine, AROM L UE, palpable muscle tightness and pain periscapular region, palpable tightness L UE, pain at rest, pain with movement.  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  PT to address deficits to focus on pain reduction and initiation of gentle ROM, will progress to tolerance.    Personal Factors and Comorbidities Comorbidity 1    Comorbidities HTN    Examination-Activity Limitations Bed Mobility;Reach Overhead;Lift;Hygiene/Grooming    Examination-Participation Restrictions Cleaning;Occupation;Meal Prep;Driving;Community Activity;Laundry    Stability/Clinical Decision Making Stable/Uncomplicated    Clinical Decision Making Low    Rehab Potential Good    PT Frequency 2x / week    PT Duration 6 weeks    PT Treatment/Interventions ADLs/Self Care Home Management;Patient/family education;Therapeutic activities;Therapeutic exercise;Electrical Stimulation;Moist Heat;Aquatic Therapy;Iontophoresis 4mg /ml Dexamethasone;Cryotherapy;Traction;Manual techniques;Dry needling;Passive range of motion;Taping    PT Next Visit Plan Trial HEP initially prescribed and progress to gentle scapular mobilization and retraction; progress to AAROM L UE as able; manual and DN for scapular and upper trap musculature;  progress neck ROM and postural strengthening when able.    PT Home Exercise Plan [email or web address]    Consulted and Agree with Plan of Care Patient             Patient will benefit from skilled therapeutic intervention in order to improve the following deficits and impairments:  Pain, Impaired flexibility, Hypomobility, Increased fascial restricitons, Impaired tone, Decreased range of motion, Decreased strength, Impaired sensation, Postural dysfunction, Increased muscle spasms, Decreased activity tolerance  Visit Diagnosis: Cervicalgia  Abnormal posture  Muscle weakness (generalized)     Problem List Patient Active Problem List   Diagnosis Date Noted   Tinea capitis 12/18/2020   Cardiomegaly 11/29/2020   Carotid bruit 11/29/2020   Systolic murmur 11/29/2020   Idiopathic anaphylaxis 11/29/2020   Chronic right-sided headache 11/29/2020   Cervical radiculopathy 10/04/2020   Ankle swelling, right 07/05/2020   Toenail fungus 07/05/2020   Localized swelling, mass and lump, neck 06/15/2018   Intervertebral disc disorder 06/04/2018   Neck sprain 06/04/2018   Shortness of breath 06/04/2018   Urinary frequency 04/22/2018   Anxiety disorder 03/18/2017   Diverticula of intestine 01/16/2017   Obstructive sleep apnea syndrome 11/11/2016   Hyperlipidemia 04/09/2016   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

## 2021-01-03 NOTE — Patient Instructions (Signed)
Access Code: [email or web address] URL: [email or web address] Date: 01/03/2021 Prepared by: Margretta Ditty  Exercises Circular Shoulder Pendulum with Table Support - 2 x daily - 7 x weekly - 1 sets - 10 reps Seated Shoulder Flexion Slide at Table Top with Forearm in Neutral - 2 x daily - 7 x weekly - 1 sets - 8 reps Seated Shoulder Abduction Towel Slide at Table Top with Forearm in Neutral - 2 x daily - 7 x weekly - 1 sets - 8 reps

## 2021-01-07 ENCOUNTER — Ambulatory Visit: Payer: No Typology Code available for payment source | Admitting: Physical Therapy

## 2021-01-07 ENCOUNTER — Other Ambulatory Visit: Payer: Self-pay

## 2021-01-07 DIAGNOSIS — R293 Abnormal posture: Secondary | ICD-10-CM | POA: Diagnosis not present

## 2021-01-07 DIAGNOSIS — M542 Cervicalgia: Secondary | ICD-10-CM

## 2021-01-07 DIAGNOSIS — M6281 Muscle weakness (generalized): Secondary | ICD-10-CM | POA: Diagnosis not present

## 2021-01-07 NOTE — Patient Instructions (Signed)

## 2021-01-07 NOTE — Therapy (Signed)
[facility] [place] [address] [place], [place], [address] Phone: [phone]   Fax:  [phone]  Physical Therapy Treatment  Patient Details  Name: Jessica Powell MRN: [id number] Date of Birth: 12-06-1980 Referring Provider (PT): Joseph Berkshire, MD   Encounter Date: 01/07/2021   PT End of Session - 01/07/21 1025     Visit Number 2    Number of Visits 12    Date for PT Re-Evaluation 02/14/21    Authorization Type cent[employer]    PT Start Time 1021    PT Stop Time 1101    PT Time Calculation (min) 40 min    Activity Tolerance Patient limited by pain             Past Medical History:  Diagnosis Date   Allergy    Asthma    Hypertension    OSA on CPAP    PCOS (polycystic ovarian syndrome)     Past Surgical History:  Procedure Laterality Date   TONSILLECTOMY     UVULOPALATOPHARYNGOPLASTY      There were no vitals filed for this visit.   Subjective Assessment - 01/07/21 1023     Subjective Pt reports her neck hurts more, but she can move arm better.  "It drops sometimes, it's just weak".  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    Pertinent History HTN, asthma, sleep apnea    Diagnostic tests MRI:  IMPRESSION:  Degenerative disc disease at C5-C6 and C6-C7.    Patient Stated Goals reduce pain    Currently in Pain? Yes    Pain Score 6     Pain Location Neck    Pain Orientation Left    Pain Descriptors / Indicators Aching;Tightness    Pain Onset More than a month ago                [facility] PT Assessment - 01/07/21 0001       Assessment   Medical Diagnosis cervical radiculopathy    Referring Provider (PT) Joseph Berkshire, MD    Onset Date/Surgical Date 09/12/20    Hand Dominance Right    Prior Therapy went to PT over the summer- made sxs worse      AROM   Cervical - Right Side Bend 20    Cervical - Left Side Bend 10    Cervical - Right Rotation 40     Cervical - Left Rotation 32               [facility] Adult PT Treatment/Exercise - 01/07/21 0001       Exercises   Exercises Neck      Neck Exercises: Seated   Neck Retraction 5 reps;5 secs    Cervical Rotation Right;Left;5 reps with light head nods   Lateral Flexion Right;5 reps    Other Seated Exercise seated, shoulder flexion stretch with table x 5 reps      Neck Exercises: Stretches   Corner Stretch 2 reps;10 seconds    Other Neck Stretches standing LUE nerve glides, with wrist flex/ext x 5.      Moist Heat Therapy   Number Minutes Moist Heat 12 Minutes    Moist Heat Location Cervical;Shoulder   pt in Rt sidelying     Electrical Stimulation   Electrical Stimulation Location Lt upper trap, Lt rhomboid    Electrical Stimulation Action TENS    Electrical Stimulation Parameters intensity to tolerance, 12 min  Electrical Stimulation Goals Pain                          PT Long Term Goals - 01/03/21 1631       PT LONG TERM GOAL #1   Title The patient will be indep with HEP for cervical stretching, postural strengthening, UE ROM.    Time 6    Period Weeks    Target Date 02/14/21      PT LONG TERM GOAL #2   Title The patient will improve FOTO functional status score from 3% up to 38%.    Time 6    Period Weeks    Target Date 02/14/21      PT LONG TERM GOAL #3   Title The patient will improve L UE AROM to reach a shelf above shoulder height.    Baseline [name] does not tolerate any ROM (passive or active) of the L UE today    Time 6    Period Weeks    Target Date 02/14/21      PT LONG TERM GOAL #4   Title The patient will report pain at rest 2/10.    Baseline 6/10 during subjective, but gets increased pain with minimal examination    Time 6    Period Weeks    Target Date 02/14/21      PT LONG TERM GOAL #5   Title The patient will improve cervical AROM by 10 degrees in all planes.    Baseline Severely limited.    Time 6    Period Weeks     Target Date 02/14/21                   Plan - 01/07/21 1155     Clinical Impression Statement Pt demonstrated improved cervical ROM from previous date.  She continues to be limited with exercise tolerance and cannot tolerate supine position. Kept exercises to 5 reps and to tolerance; pt given minor cues for form and posture.  Issued information of TENS for pain management at home.    Personal Factors and Comorbidities Comorbidity 1    Comorbidities HTN    Examination-Activity Limitations Bed Mobility;Reach Overhead;Lift;Hygiene/Grooming    Examination-Participation Restrictions Cleaning;Occupation;Meal Prep;Driving;Community Activity;Laundry    Stability/Clinical Decision Making Stable/Uncomplicated    Rehab Potential Good    PT Frequency 2x / week    PT Duration 6 weeks    PT Treatment/Interventions ADLs/Self Care Home Management;Patient/family education;Therapeutic activities;Therapeutic exercise;Electrical Stimulation;Moist Heat;Aquatic Therapy;Iontophoresis 4mg /ml Dexamethasone;Cryotherapy;Traction;Manual techniques;Dry needling;Passive range of motion;Taping    PT Next Visit Plan progress to AAROM L UE as able; manual and DN for scapular and upper trap musculature; progress neck ROM and postural strengthening as able.    PT Home Exercise Plan [id number]    Consulted and Agree with Plan of Care Patient             Patient will benefit from skilled therapeutic intervention in order to improve the following deficits and impairments:  Pain, Impaired flexibility, Hypomobility, Increased fascial restricitons, Impaired tone, Decreased range of motion, Decreased strength, Impaired sensation, Postural dysfunction, Increased muscle spasms, Decreased activity tolerance  Visit Diagnosis: Cervicalgia  Abnormal posture  Muscle weakness (generalized)     Problem List Patient Active Problem List   Diagnosis Date Noted   Tinea capitis 12/18/2020   Cardiomegaly 11/29/2020    Carotid bruit 11/29/2020   Systolic murmur 11/29/2020   Idiopathic anaphylaxis 11/29/2020   Chronic right-sided headache 11/29/2020  Cervical radiculopathy 10/04/2020   Ankle swelling, right 07/05/2020   Toenail fungus 07/05/2020   Localized swelling, mass and lump, neck 06/15/2018   Intervertebral disc disorder 06/04/2018   Neck sprain 06/04/2018   Shortness of breath 06/04/2018   Urinary frequency 04/22/2018   Anxiety disorder 03/18/2017   Diverticula of intestine 01/16/2017   Obstructive sleep apnea syndrome 11/11/2016   Hyperlipidemia 04/09/2016   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 [place] [address] [place], [place], [address] Phone: [phone]   Fax:  [phone]  Name: Jessica Powell MRN: [id number] Date of Birth: [birth year]

## 2021-01-09 ENCOUNTER — Other Ambulatory Visit: Payer: Self-pay

## 2021-01-09 ENCOUNTER — Ambulatory Visit (INDEPENDENT_AMBULATORY_CARE_PROVIDER_SITE_OTHER): Payer: No Typology Code available for payment source | Admitting: Physical Therapy

## 2021-01-09 DIAGNOSIS — R293 Abnormal posture: Secondary | ICD-10-CM

## 2021-01-09 DIAGNOSIS — M6281 Muscle weakness (generalized): Secondary | ICD-10-CM

## 2021-01-09 DIAGNOSIS — M542 Cervicalgia: Secondary | ICD-10-CM | POA: Diagnosis not present

## 2021-01-09 NOTE — Therapy (Signed)
[facility] [place] [address] [place], [place], [address] Phone: [phone]   Fax:  [phone]  Physical Therapy Treatment  Patient Details  Name: Jessica Powell MRN: [id number] Date of Birth: April 02, 1981 Referring Provider (PT): Joseph Berkshire, MD   Encounter Date: 01/09/2021   PT End of Session - 01/09/21 1446     Visit Number 3    Number of Visits 12    Date for PT Re-Evaluation 02/14/21    Authorization Type [employer]    PT Start Time 1433    PT Stop Time 1521    PT Time Calculation (min) 48 min    Activity Tolerance Patient limited by pain             Past Medical History:  Diagnosis Date   Allergy    Asthma    Hypertension    OSA on CPAP    PCOS (polycystic ovarian syndrome)     Past Surgical History:  Procedure Laterality Date   TONSILLECTOMY     UVULOPALATOPHARYNGOPLASTY      There were no vitals filed for this visit.   Subjective Assessment - 01/09/21 1440     Subjective Pt reports she tried laying on back, "It didn't go well. It hurts."  She reports that she has a headache today (6/10).  She complains that her Lt hand has swelled back up, and her Lt thumb and first digit are not working well.    Diagnostic tests MRI:  IMPRESSION:  Degenerative disc disease at C5-C6 and C6-C7.    Currently in Pain? Yes    Pain Score 7     Pain Location Neck    Pain Orientation Left    Pain Descriptors / Indicators Aching;Tightness;Sharp    Pain Radiating Towards numbness in Thumb and first digit.    Aggravating Factors  movement, laying down    Pain Relieving Factors medication.                [facility] PT Assessment - 01/09/21 0001       Assessment   Medical Diagnosis cervical radiculopathy    Referring Provider (PT) Joseph Berkshire, MD    Onset Date/Surgical Date 09/12/20    Hand Dominance Right    Next MD Visit 01/14/21    Prior Therapy went to PT over the summer- made sxs worse                [facility] Adult PT Treatment/Exercise - 01/09/21 0001       Exercises   Exercises Hand      Neck Exercises: Machines for Strengthening   Nustep L3: arms/legs, gentle ROM for arm x 4.5 min      Neck Exercises: Seated   Cervical Rotation Right;Left;5 reps    Other Seated Exercise shoulder rolls x 5 reps (limited motion)    Other Seated Exercise seated shoulder flexion (table slide) x 8 reps, Lt shoulder ER table x 8 reps (for ROM of shoulder).  Lt shoulder scaption table slide x 1 rep 9 increased radicular symptoms - stopped.      Hand Exercises   Opposition AAROM;AROM;Left;5 reps    Opposition Limitations limited ability with thumb to first digit    Other Hand Exercises wringing motion with pillow case      Moist Heat Therapy   Number Minutes Moist Heat 10 Minutes    Moist Heat Location Cervical   seated     Electrical Stimulation   Electrical Stimulation Location Lt upper  trap, Lt upper thoracic    Electrical Stimulation Action TENS    Electrical Stimulation Parameters intensity to tolerance, 10 min    Electrical Stimulation Goals Pain      Manual Therapy   Manual Therapy Taping    Manual therapy comments unable to tolerate light touch to Lt scapula and upper trap.    Kinesiotex Facilitate Muscle      Kinesiotix   Facilitate Muscle  I strip of sensitive skin Rock tape from inferior angle of Lt scap to spine with 15% stretch - to increase proprioception and desensitize area.            Education: pt given handout regarding kinesiology tape and safe removal.  Pt verbalized understanding.    PT Long Term Goals - 01/03/21 1631       PT LONG TERM GOAL #1   Title The patient will be indep with HEP for cervical stretching, postural strengthening, UE ROM.    Time 6    Period Weeks    Target Date 02/14/21      PT LONG TERM GOAL #2   Title The patient will improve FOTO functional status score from 3% up to 38%.    Time 6    Period Weeks    Target Date 02/14/21       PT LONG TERM GOAL #3   Title The patient will improve L UE AROM to reach a shelf above shoulder height.    Baseline Leilanie does not tolerate any ROM (passive or active) of the L UE today    Time 6    Period Weeks    Target Date 02/14/21      PT LONG TERM GOAL #4   Title The patient will report pain at rest 2/10.    Baseline 6/10 during subjective, but gets increased pain with minimal examination    Time 6    Period Weeks    Target Date 02/14/21      PT LONG TERM GOAL #5   Title The patient will improve cervical AROM by 10 degrees in all planes.    Baseline Severely limited.    Time 6    Period Weeks    Target Date 02/14/21                   Plan - 01/09/21 1519     Clinical Impression Statement Cervical ROM more limited today than last session. Continued limited tolerance for exercises for neck and shoulder.  Pt reports mild pain relief from TENS/MHP.  Very limited tolerance to light touch to Lt scapula and periscapular musculature. She requires position changes during session due to increased pain if sitting/ standing still. Goals are ongoing.    Personal Factors and Comorbidities Comorbidity 1    Comorbidities HTN    Examination-Activity Limitations Bed Mobility;Reach Overhead;Lift;Hygiene/Grooming    Examination-Participation Restrictions Cleaning;Occupation;Meal Prep;Driving;Community Activity;Laundry    Stability/Clinical Decision Making Stable/Uncomplicated    Rehab Potential Good    PT Frequency 2x / week    PT Duration 6 weeks    PT Treatment/Interventions ADLs/Self Care Home Management;Patient/family education;Therapeutic activities;Therapeutic exercise;Electrical Stimulation;Moist Heat;Aquatic Therapy;Iontophoresis 4mg /ml Dexamethasone;Cryotherapy;Traction;Manual techniques;Dry needling;Passive range of motion;Taping    PT Next Visit Plan progress to AAROM L UE as able; manual and DN for scapular and upper trap musculature; progress neck ROM and postural  strengthening as able. ASSESS RESPONSE to tape.    PT Home Exercise Plan [id number]    Consulted and Agree with Plan of Care Patient  Patient will benefit from skilled therapeutic intervention in order to improve the following deficits and impairments:  Pain, Impaired flexibility, Hypomobility, Increased fascial restricitons, Impaired tone, Decreased range of motion, Decreased strength, Impaired sensation, Postural dysfunction, Increased muscle spasms, Decreased activity tolerance  Visit Diagnosis: Cervicalgia  Abnormal posture  Muscle weakness (generalized)     Problem List Patient Active Problem List   Diagnosis Date Noted   Tinea capitis 12/18/2020   Cardiomegaly 11/29/2020   Carotid bruit 11/29/2020   Systolic murmur 11/29/2020   Idiopathic anaphylaxis 11/29/2020   Chronic right-sided headache 11/29/2020   Cervical radiculopathy 10/04/2020   Ankle swelling, right 07/05/2020   Toenail fungus 07/05/2020   Localized swelling, mass and lump, neck 06/15/2018   Intervertebral disc disorder 06/04/2018   Neck sprain 06/04/2018   Shortness of breath 06/04/2018   Urinary frequency 04/22/2018   Anxiety disorder 03/18/2017   Diverticula of intestine 01/16/2017   Obstructive sleep apnea syndrome 11/11/2016   Hyperlipidemia 04/09/2016   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   Asthma without status asthmaticus 04/24/2015   Seasonal allergies 04/24/2015   Seborrheic dermatitis 01/16/2014   Electrocardiogram abnormal 06/01/2013   Hypertensive heart disease without congestive heart failure 06/01/2013   Palpitations 06/01/2013   Polycystic ovaries 04/30/2013   Essential hypertension 12/11/2011   Allergic rhinitis 05/13/2011   Mayer Camel, PTA 01/09/21 4:16 PM  [facility] [place] [address] [place] [address] [place], [place], [address] Phone: [phone]   Fax:  [phone]  Name: Lillis Nuttle MRN: [id number] Date of  Birth: 11/06/1980

## 2021-01-09 NOTE — Patient Instructions (Signed)

## 2021-01-16 ENCOUNTER — Encounter: Payer: No Typology Code available for payment source | Admitting: Physical Therapy

## 2021-01-18 ENCOUNTER — Ambulatory Visit (INDEPENDENT_AMBULATORY_CARE_PROVIDER_SITE_OTHER): Payer: No Typology Code available for payment source | Admitting: Physical Therapy

## 2021-01-18 ENCOUNTER — Other Ambulatory Visit: Payer: Self-pay

## 2021-01-18 ENCOUNTER — Encounter: Payer: Self-pay | Admitting: Physical Therapy

## 2021-01-18 DIAGNOSIS — M6281 Muscle weakness (generalized): Secondary | ICD-10-CM | POA: Diagnosis not present

## 2021-01-18 DIAGNOSIS — M542 Cervicalgia: Secondary | ICD-10-CM

## 2021-01-18 DIAGNOSIS — R293 Abnormal posture: Secondary | ICD-10-CM

## 2021-01-18 NOTE — Therapy (Addendum)
[place] [address] [place], [place], [address] Phone: [phone]   Fax:  [phone]  Physical Therapy Treatment/Discharge Summary  Patient Details  Name: Jessica Powell MRN: [id number] Date of Birth: [birth year] Referring Provider (PT): Rosette Reveal, MD   Encounter Date: 01/18/2021   PT End of Session - 01/18/21 1424     Visit Number 4    Number of Visits 12    Date for PT Re-Evaluation 02/14/21    Authorization Type [employer]    PT Start Time 1409   pt arrived late   PT Stop Time 1447    PT Time Calculation (min) 38 min    Activity Tolerance Patient limited by pain    Behavior During Therapy [facility] for tasks assessed/performed             Past Medical History:  Diagnosis Date   Allergy    Asthma    Hypertension    OSA on CPAP    PCOS (polycystic ovarian syndrome)     Past Surgical History:  Procedure Laterality Date   TONSILLECTOMY     UVULOPALATOPHARYNGOPLASTY      There were no vitals filed for this visit.   Subjective Assessment - 01/18/21 1411     Subjective Pt reports that the tape was ok the first day, but the next day was torture. she took it off with a back scratcher.  She saw neurosurgeon and he is recommending surgery for a foramenectomy.  She is out of work until 02/25/21.  She has been tryng to use her Lt arm more, and has been working on opposition.    Diagnostic tests MRI:  IMPRESSION:  Degenerative disc disease at C5-C6 and C6-C7.    Currently in Pain? Yes    Pain Score 5     Pain Location Arm    Pain Orientation Left    Pain Descriptors / Indicators Throbbing;Sore    Aggravating Factors  movement, touch    Pain Relieving Factors ?                [facility] PT Assessment - 01/18/21 0001       Assessment   Medical Diagnosis cervical radiculopathy    Referring Provider (PT) Rosette Reveal, MD    Onset Date/Surgical Date 09/12/20    Hand Dominance Right    Prior Therapy  went to PT over the summer- made sxs worse      AROM   Cervical Flexion 20    Cervical Extension 15    Cervical - Right Rotation 37    Cervical - Left Rotation 37              [facility] Adult PT Treatment/Exercise - 01/18/21 0001       Exercises   Exercises Shoulder      Neck Exercises: Seated   Cervical Isometrics Flexion;Extension;5 reps    Cervical Rotation Right;Left   2 reps     Neck Exercises: Supine   Other Supine Exercise supine with knees bent x 3 min.      Neck Exercises: Sidelying   Other Sidelying Exercise Lt shoulder ER AROM x 5 reps (limited      Shoulder Exercises: Seated   Other Seated Exercises scap retraction x 5 sec x 5 reps      Shoulder Exercises: Pulleys   Flexion 3 minutes   to tolerance     Hand Exercises   Opposition AAROM;Left;5 reps    Other Hand Exercises soft  hand master x 10 squeezes and 5 finger ext.    Other Hand Exercises key grip with pronation/ supination x 8 reps on velcro board (light strip)      Wrist Exercises   Wrist Extension AROM;10 reps    Other wrist exercises pronation/supination x LUE x 10                 PT Long Term Goals - 01/03/21 1631       PT LONG TERM GOAL #1   Title The patient will be indep with HEP for cervical stretching, postural strengthening, UE ROM.    Time 6    Period Weeks    Target Date 02/14/21      PT LONG TERM GOAL #2   Title The patient will improve FOTO functional status score from 3% up to 38%.    Time 6    Period Weeks    Target Date 02/14/21      PT LONG TERM GOAL #3   Title The patient will improve L UE AROM to reach a shelf above shoulder height.    Baseline Jessica Powell does not tolerate any ROM (passive or active) of the L UE today    Time 6    Period Weeks    Target Date 02/14/21      PT LONG TERM GOAL #4   Title The patient will report pain at rest 2/10.    Baseline 6/10 during subjective, but gets increased pain with minimal examination    Time 6    Period Weeks     Target Date 02/14/21      PT LONG TERM GOAL #5   Title The patient will improve cervical AROM by 10 degrees in all planes.    Baseline Severely limited.    Time 6    Period Weeks    Target Date 02/14/21                   Plan - 01/18/21 1621     Clinical Impression Statement Pt not tolerant of ktape; will not retape today.  Pt able to lay on back for 3 min today which is great progress.  Continues with limited tolerance to touch to Lt scapula, upper trap and Lt UE.  cervical ROM has improved.  Trialed additional ROM and strengthening exercises for LUE.  Goals are ongoing.    Personal Factors and Comorbidities Comorbidity 1    Comorbidities HTN    Examination-Activity Limitations Bed Mobility;Reach Overhead;Lift;Hygiene/Grooming    Examination-Participation Restrictions Cleaning;Occupation;Meal Prep;Driving;Community Activity;Laundry    Stability/Clinical Decision Making Stable/Uncomplicated    Rehab Potential Good    PT Frequency 2x / week    PT Duration 6 weeks    PT Treatment/Interventions ADLs/Self Care Home Management;Patient/family education;Therapeutic activities;Therapeutic exercise;Electrical Stimulation;Moist Heat;Aquatic Therapy;Iontophoresis 45m/ml Dexamethasone;Cryotherapy;Traction;Manual techniques;Dry needling;Passive range of motion;Taping    PT Next Visit Plan progress to AAROM L UE as able; manual and DN for scapular and upper trap musculature when tolerated; progress neck ROM and postural strengthening as able.    PT Home Exercise Plan [id number]    Consulted and Agree with Plan of Care Patient             Patient will benefit from skilled therapeutic intervention in order to improve the following deficits and impairments:  Pain, Impaired flexibility, Hypomobility, Increased fascial restricitons, Impaired tone, Decreased range of motion, Decreased strength, Impaired sensation, Postural dysfunction, Increased muscle spasms, Decreased activity  tolerance  Visit Diagnosis: Cervicalgia  Abnormal  posture  Muscle weakness (generalized)     Problem List Patient Active Problem List   Diagnosis Date Noted   Tinea capitis 12/18/2020   Cardiomegaly 11/29/2020   Carotid bruit 82/42/9980   Systolic murmur 69/99/6722   Idiopathic anaphylaxis 11/29/2020   Chronic right-sided headache 11/29/2020   Cervical radiculopathy 10/04/2020   Ankle swelling, right 07/05/2020   Toenail fungus 07/05/2020   Localized swelling, mass and lump, neck 06/15/2018   Intervertebral disc disorder 06/04/2018   Neck sprain 06/04/2018   Shortness of breath 06/04/2018   Urinary frequency 04/22/2018   Anxiety disorder 03/18/2017   Diverticula of intestine 01/16/2017   Obstructive sleep apnea syndrome 11/11/2016   Hyperlipidemia 04/09/2016   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   Asthma without status asthmaticus 04/24/2015   Seasonal allergies 04/24/2015   Seborrheic dermatitis 01/16/2014   Electrocardiogram abnormal 06/01/2013   Hypertensive heart disease without congestive heart failure 06/01/2013   Palpitations 06/01/2013   Polycystic ovaries 04/30/2013   Essential hypertension 12/11/2011   Allergic rhinitis 05/13/2011    PHYSICAL THERAPY DISCHARGE SUMMARY  Visits from Start of Care: 4  Current functional level related to goals / functional outcomes: See above note-- patient was following up with neurosurgeon   Remaining deficits: See above note for last known patient status.   Education / Equipment: HEP   Patient agrees to discharge. Patient goals were not met. Patient is being discharged due to not returning since the last visit.  Rudell Cobb, MPT  Kerin Perna, [place] 01/18/21 4:24 PM   [facility] [place], [place], [address] Phone: [phone]   Fax:  [phone]  Name: Doree Kuehne MRN: [id number] Date of Birth: 04-10-1980

## 2021-01-23 ENCOUNTER — Encounter: Payer: BLUE CROSS/BLUE SHIELD | Admitting: Physical Therapy

## 2021-01-25 ENCOUNTER — Other Ambulatory Visit (HOSPITAL_COMMUNITY): Payer: Self-pay

## 2021-01-25 ENCOUNTER — Encounter: Payer: No Typology Code available for payment source | Admitting: Physical Therapy

## 2021-01-26 ENCOUNTER — Other Ambulatory Visit (HOSPITAL_COMMUNITY): Payer: Self-pay

## 2021-01-26 MED ORDER — GABAPENTIN 300 MG PO CAPS
300.0000 mg | ORAL_CAPSULE | Freq: Three times a day (TID) | ORAL | 2 refills | Status: DC
Start: 2021-01-25 — End: 2021-03-20
  Filled 2021-01-26: qty 120, 40d supply, fill #0
  Filled 2021-03-07: qty 120, 40d supply, fill #1

## 2021-01-26 MED ORDER — BUDESONIDE-FORMOTEROL FUMARATE 160-4.5 MCG/ACT IN AERO
2.0000 | INHALATION_SPRAY | RESPIRATORY_TRACT | 0 refills | Status: DC
  Filled 2021-01-26: qty 10.2, 30d supply, fill #0

## 2021-01-28 ENCOUNTER — Other Ambulatory Visit (HOSPITAL_COMMUNITY): Payer: Self-pay

## 2021-01-28 ENCOUNTER — Ambulatory Visit: Payer: Self-pay

## 2021-01-28 ENCOUNTER — Other Ambulatory Visit: Payer: Self-pay | Admitting: Family Medicine

## 2021-01-28 DIAGNOSIS — J45909 Unspecified asthma, uncomplicated: Secondary | ICD-10-CM

## 2021-01-28 MED ORDER — MONTELUKAST SODIUM 10 MG PO TABS
10.0000 mg | ORAL_TABLET | Freq: Every day | ORAL | 2 refills | Status: DC
  Filled 2021-01-28: qty 30, 30d supply, fill #0
  Filled 2021-03-07: qty 30, 30d supply, fill #1
  Filled 2021-04-10: qty 30, 30d supply, fill #2

## 2021-01-28 NOTE — Telephone Encounter (Signed)
Requested Prescriptions  Pending Prescriptions Disp Refills  . montelukast (SINGULAIR) 10 MG tablet 30 tablet 2    Sig: Take 1 tablet (10 mg total) by mouth at bedtime.     Pulmonology:  Leukotriene Inhibitors Passed - 01/28/2021  3:12 PM      Passed - Valid encounter within last 12 months    Recent Outpatient Visits          1 month ago Annual physical exam   [facility] Jerrol Banana, MD   2 months ago Asthma without status asthmaticus without complication, unspecified asthma severity, unspecified whether persistent   [facility] Jerrol Banana, MD      Future Appointments            In 2 days Drema Dallas, DO [facility] [place]   In 1 month Ashley Royalty, Ocie Bob, MD [facility], [facility]

## 2021-01-28 NOTE — Telephone Encounter (Signed)
Reason for Disposition . Nursing judgment or information in reference  Answer Assessment - Initial Assessment Questions 1. APPEARANCE of MOVEMENT: "What did the jerking or twitching look like?" (e.g., body area)     Shaky and sweating 2. ONSET: "When did this start happening?" (e.g., hours, days, weeks, months ago)     Sunday 3. DURATION: "How long does the jerk, twitch, or spasm last?"     Until after she wakes up 4. FREQUENCY:  "How often does this happen?"      Yesterday and today 5. WHEN: "When does this happen?" (e.g., while awake, while falling asleep, while sleeping)     Upon waking to a phone call and while awake 6. CAUSE: "What do you think caused the low blood sugar?"     Not eating - pt ate breakfast at 1000. 7. OTHER SYMPTOMS: "Are there any other symptoms?" (e.g., fever, headache)     No 8. PREGNANCY: "Is there any chance you are pregnant?" "When was your last menstrual period?"     *No Answer*  Answer Assessment - Initial Assessment Questions 1. REASON FOR CALL: "What is your main concern right now?"     Feeling shaky and sweating and thinks blood sugar 2. ONSET: "When did the sweating and shakiness start?"     yesterday 3. SEVERITY: "How bad is the sx?"     Mild  4. FEVER: "Do you have a fever?"     no 5. OTHER SYMPTOMS: "Do you have any other new symptoms?"     no 6. TREATMENTS AND RESPONSE: "What have you done so far to try to make this better? What medicines have you used?"     Had pt eat and then she felt better 7. PREGNANCY: "Is there any chance you are pregnant?" "When was your last menstrual period?"     N/a  Protocols used: Muscle Jerks - Tics - Shudders-A-AH, No Guideline Available-A-AH

## 2021-01-29 ENCOUNTER — Other Ambulatory Visit (HOSPITAL_COMMUNITY): Payer: Self-pay

## 2021-01-29 ENCOUNTER — Ambulatory Visit: Payer: Self-pay

## 2021-01-29 NOTE — Progress Notes (Signed)
NEUROLOGY CONSULTATION NOTE  Jessica Powell MRN: [id number] DOB: 1981/03/18  Referring provider: Joseph Berkshire, MD Primary care provider: Joseph Berkshire, MD  Reason for consult:  headache  Assessment/Plan:   Probable primary stabbing headache - will still rule out secondary etiologies.  Already on gabapentin for radiculopathy which isn't helping the headache.  Would like to avoid indomethacin, so will have her try melatonin Left sided cervical radiculopathy  1  To rule out secondary causes for headache, check MRI and MRA of brain without contrast 2  For headache, start melatonin - she will start with 1mg  at bedtime but can gradually increase to 10mg  at bedtime if needed/tolerated. 3  Follow up with neurosurgery 4  Follow up with me in 6 months.   Subjective:  Jessica Powell is a 40 year old female with HTN, asthma, PCOS and OSA who presents for headaches.  History supplemented by referring provider's note.  She has history of migraines.  In April-May 2022, she started having a new type of headache.  She describes a severe stabbing pain in the right posterior parietal region lasting a few seconds.  At first, it occurred every couple of days but frequency gradually increased to several times a day.    In June, she developed severe left upper extremity pain.  She also had left occipital headaches with neck pain.  MRI of cervical spine with and without contrast on 12/20/2020 personally reviewed showed degenerative disc disease at C5-6 contacting the ventral cord without stenosis and at C6-7 with minor canal stenosis and mild left foraminal stenosis.   Epidural injection effective for just a few days.  Taking gabapentin 300 in AM and 300mg  at 6-7PM and 300mg  at bedtime.  Just finished physical therapy.  Still with pain and weakness.  Following up with neurosurgery to discuss possible surgery.  Current NSAIDS/analgesics:  none Current triptans:  none Current ergotamine:  none Current  anti-emetic:  none Current muscle relaxants:  diazepam 2mg  PRN (muscle spasms) Current Antihypertensive medications:  none Current Antidepressant medications:  none Current Anticonvulsant medications:  gabapentin 300mg  in AM and 600mg  QHS (cervical radic) Current anti-CGRP:  none Current Vitamins/Herbal/Supplements:  none Current Antihistamines/Decongestants:  Zyrtec Other therapy:  none Hormone/birth control:  none  Past NSAIDS/analgesics:  meloxicam, tramadol, hydrocodone-acetaminophen Past abortive triptans:  none Past abortive ergotamine:  none Past muscle relaxants:  none Past anti-emetic:  Flexeril, Robaxin Past antihypertensive medications:  amlodipine, telmisartan-HCTZ Past antidepressant medications:  none Past anticonvulsant medications:  none Past anti-CGRP:  none Past vitamins/Herbal/Supplements:  none Past antihistamines/decongestants:  none Other past therapies:  none   PAST MEDICAL HISTORY: Past Medical History:  Diagnosis Date   Allergy    Asthma    Hypertension    OSA on CPAP    PCOS (polycystic ovarian syndrome)     PAST SURGICAL HISTORY: Past Surgical History:  Procedure Laterality Date   TONSILLECTOMY     UVULOPALATOPHARYNGOPLASTY      MEDICATIONS: Current Outpatient Medications on File Prior to Visit  Medication Sig Dispense Refill   albuterol (VENTOLIN HFA) 108 (90 Base) MCG/ACT inhaler Inhale into the lungs.     budesonide-formoterol (SYMBICORT) 160-4.5 MCG/ACT inhaler Inhale 2 puffs into the lungs in the morning and at bedtime.     budesonide-formoterol (SYMBICORT) 160-4.5 MCG/ACT inhaler Inhale 2 puffs into the lungs as directed. 10.2 g 0   cetirizine (ZYRTEC) 10 MG tablet Take 10 mg by mouth daily.     diazepam (VALIUM) 2 MG tablet Take 1 tablet (2  mg total) by mouth every 6 (six) hours as needed for muscle spasms. 15 tablet 0   EPINEPHrine 0.3 mg/0.3 mL IJ SOAJ injection Inject 0.3 mg into the muscle once.     gabapentin (NEURONTIN) 300 MG  capsule Take 300 mg by mouth 2 (two) times daily.     gabapentin (NEURONTIN) 300 MG capsule Take 1 capsule (300 mg total) by mouth in the morning, at noon, and at bedtime. 120 capsule 2   HYDROcodone-acetaminophen (NORCO/VICODIN) 5-325 MG tablet Take 1-2 tablets by mouth every 4 (four) hours as needed. 10 tablet 0   ketoconazole (NIZORAL) 2 % shampoo Apply to scalp twice a week for 8 weeks and then weekly thereafter 120 mL 11   letrozole (FEMARA) 2.5 MG tablet Take 7.5 mg by mouth daily.     montelukast (SINGULAIR) 10 MG tablet Take 1 tablet (10 mg total) by mouth at bedtime. 30 tablet 2   nitrofurantoin, macrocrystal-monohydrate, (MACROBID) 100 MG capsule Take 1 capsule (100 mg total) by mouth 2 (two) times daily. 14 capsule 0   predniSONE (STERAPRED UNI-PAK 21 TAB) 10 MG (21) TBPK tablet Take by mouth daily. Take 6 tabs by mouth daily  for 2 days, then 5 tabs for 2 days, then 4 tabs for 2 days, then 3 tabs for 2 days, 2 tabs for 2 days, then 1 tab by mouth daily for 2 days 42 tablet 0   telmisartan-hydrochlorothiazide (MICARDIS HCT) 80-25 MG tablet Take 1 tablet by mouth daily.     No current facility-administered medications on file prior to visit.    ALLERGIES: Allergies  Allergen Reactions   Clindamycin/Lincomycin Hives and Swelling   Gramineae Pollens Shortness Of Breath   Influenza Vaccines Anaphylaxis   Mushroom Extract Complex Swelling   Shellfish Allergy Swelling   Shrimp (Diagnostic) Swelling   Avocado Swelling   Labetalol     Other reaction(s): neurological reaction headache   Latex Itching   Lisinopril Cough    Other reaction(s): neurological reaction headache   Magnesium Other (See Comments)    Neurological reaction   Metformin Hcl Other (See Comments)    Hypoglycemia   Other Swelling   Pineapple Swelling   Sumatriptan Other (See Comments)    Neurological reaction    FAMILY HISTORY: Family History  Problem Relation Age of Onset   Thyroid disease Mother     Healthy Sister    Hypertension Brother    Healthy Brother     Objective:  Blood pressure (!) 131/91, pulse 86, height 5\' 2"  (1.575 m), weight 224 lb 9.6 oz (101.9 kg), SpO2 96 %. General: No acute distress.  Patient appears well-groomed.   Head:  Normocephalic/atraumatic Eyes:  fundi examined but not visualized Neck: supple, no paraspinal tenderness, full range of motion Back: No paraspinal tenderness Heart: regular rate and rhythm Lungs: Clear to auscultation bilaterally. Vascular: No carotid bruits. Neurological Exam: Mental status: alert and oriented to person, place, and time, recent and remote memory intact, fund of knowledge intact, attention and concentration intact, speech fluent and not dysarthric, language intact. Cranial nerves: CN I: not tested CN II: pupils equal, round and reactive to light, visual fields intact CN III, IV, VI:  full range of motion, no nystagmus, no ptosis CN V: facial sensation intact. CN VII: upper and lower face symmetric CN VIII: hearing intact CN IX, X: gag intact, uvula midline CN XI: sternocleidomastoid and trapezius muscles intact CN XII: tongue midline Bulk & Tone: normal, no fasciculations. Motor:  5-/4 left hand  grip and triceps;otherwise, muscle strength 5/5 throughout Sensation:  Temperature and vibratory sensation intact.  Reduced light touch sensation in left index and thumb Deep Tendon Reflexes:  2+ throughout,  toes downgoing.   Finger to nose testing:  Without dysmetria.   Heel to shin:  Without dysmetria.   Gait:  Normal station and stride.  Romberg negative.    Thank you for allowing me to take part in the care of this patient.  Shon Millet, DO  CC: Joseph Berkshire, MD

## 2021-01-29 NOTE — Telephone Encounter (Signed)
Pt. States she has an appointment with a neurologist tomorrow, but wants Dr. Ashley Royalty to refer her to a neurosurgeon. "He knows what this about." She is requesting a call "back from him today." Please advise.

## 2021-01-30 ENCOUNTER — Ambulatory Visit (INDEPENDENT_AMBULATORY_CARE_PROVIDER_SITE_OTHER): Payer: No Typology Code available for payment source | Admitting: Neurology

## 2021-01-30 ENCOUNTER — Other Ambulatory Visit: Payer: Self-pay

## 2021-01-30 ENCOUNTER — Encounter: Payer: Self-pay | Admitting: Neurology

## 2021-01-30 VITALS — BP 131/91 | HR 86 | Ht 62.0 in | Wt 224.6 lb

## 2021-01-30 DIAGNOSIS — G4485 Primary stabbing headache: Secondary | ICD-10-CM

## 2021-01-30 DIAGNOSIS — M5412 Radiculopathy, cervical region: Secondary | ICD-10-CM | POA: Diagnosis not present

## 2021-01-30 NOTE — Telephone Encounter (Signed)
Forwarded to [facility] for review.  Neurosurgery referral was placed 11/29/20.

## 2021-01-30 NOTE — Patient Instructions (Addendum)
Check MRI and MRA of brain. We have sent a referral to [facility] for your MRI and they will call you directly to schedule your appointment. They are located at [address] [facility]. If you need to contact them directly please call [phone].  Start melatonin 1mg  at bedtime and you may gradually increase dose to

## 2021-01-31 ENCOUNTER — Other Ambulatory Visit: Payer: Self-pay | Admitting: Neurological Surgery

## 2021-02-04 ENCOUNTER — Other Ambulatory Visit: Payer: Self-pay | Admitting: Neurological Surgery

## 2021-02-04 ENCOUNTER — Ambulatory Visit (INDEPENDENT_AMBULATORY_CARE_PROVIDER_SITE_OTHER): Payer: No Typology Code available for payment source | Admitting: Allergy

## 2021-02-04 ENCOUNTER — Other Ambulatory Visit: Payer: Self-pay

## 2021-02-04 ENCOUNTER — Encounter: Payer: Self-pay | Admitting: Allergy

## 2021-02-04 VITALS — BP 122/82 | HR 86 | Temp 98.0°F | Resp 16 | Ht 62.0 in | Wt 227.2 lb

## 2021-02-04 DIAGNOSIS — Z91018 Allergy to other foods: Secondary | ICD-10-CM

## 2021-02-04 DIAGNOSIS — J45909 Unspecified asthma, uncomplicated: Secondary | ICD-10-CM

## 2021-02-04 DIAGNOSIS — L299 Pruritus, unspecified: Secondary | ICD-10-CM | POA: Diagnosis not present

## 2021-02-04 DIAGNOSIS — T781XXD Other adverse food reactions, not elsewhere classified, subsequent encounter: Secondary | ICD-10-CM

## 2021-02-04 DIAGNOSIS — T783XXD Angioneurotic edema, subsequent encounter: Secondary | ICD-10-CM

## 2021-02-04 DIAGNOSIS — Z889 Allergy status to unspecified drugs, medicaments and biological substances status: Secondary | ICD-10-CM

## 2021-02-04 DIAGNOSIS — J3089 Other allergic rhinitis: Secondary | ICD-10-CM | POA: Diagnosis not present

## 2021-02-04 MED ORDER — ALBUTEROL SULFATE HFA 108 (90 BASE) MCG/ACT IN AERS: 2.0000 | INHALATION_SPRAY | RESPIRATORY_TRACT | 1 refills | Status: AC | PRN

## 2021-02-04 NOTE — Progress Notes (Signed)
New Patient Note  RE: Jessica Powell MRN: [id number] DOB: Aug 13, 1980 Date of Office Visit: 02/04/2021  Consult requested by: Montel Culver, MD Primary care provider: Montel Culver, MD  Chief Complaint: Allergic Rhinitis  (Itching really bad due to being off medicine for 3 days for this appointment. Two allergy reactions randomly (shrimp, Chic-fila, pineapple [frozen], over the counter facial wax stripes. Reaction was swollen, eye closed, itching. Took benadryl but started to get disoriented and couldn't formulate sentences then took a shower. Was able to breathe so she did not use her epi pen. /ACT- 25) and Allergy Testing  History of Present Illness: I had the pleasure of seeing Jessica Powell for initial evaluation at the [facility] on 02/05/2021. She is a 40 y.o. female, who is referred here by Montel Culver, MD for the evaluation of food allergies.  Food: Patient concerned about pineapple allergy. She had 2 chunks of frozen pineapple and about 3 hours afterwards she woke up to urinate and noted some itching/rash on her body and facial swelling.  She states that the swelling has improved then it started to swell again. She also noted some dizziness.  She took benadryl 50mg  and within 20-30 minutes her symptoms improved.   She also had chick-fil A that day which she had before and since then with no issues.  She used facial wax stripes which she used since then with no issues.  She has been avoiding pineapples since then.   Shrimp - patient used to be able to tolerate it. One day, she made fresh shrimp and started to have throat closing, facial swelling within 30 minutes afterwards. She took benadryl and symptoms resolved after a few hours.  Tree nuts - pecans, walnuts, pumpkin seeds which caused tongue swelling, throat itching.  Avocado - she had some type of reaction as a child and not sure what happened. Mushroom - itching, tongue swelling.   After testing - patient states she had similar reactions to Caesar dressing that had anchovies and soy.  She does have access to epinephrine autoinjector and not needed to use it.   Past work up includes: no. Dietary History: patient has been eating other foods including milk, eggs, peanut, tree nuts - no issues with cashews, limited sesame, fish, soy, wheat, meats, fruits and vegetables.  She reports reading labels and avoiding certain tree nuts, shellfish, pineapple, avocado in diet completely.  She is scheduled to have surgery on her back.  11/29/2020 PCP visit: "Idiopathic anaphylaxis Patient with multiple reported allergies, certain foods with anaphylactic component (such as shrimp).  She does have an up-to-date EpiPen, has not required use recently but had does describe shortness of breath and throat tightening upon recent exposure.  Cardiopulmonary findings benign today, given the multitude of allergies and severity of allergic response, I have advised further evaluation and management with allergy group.  A referral was placed today and patient was advised to utilize EpiPen if throat tightening noted without delay."  6/3//2020 bloodwork E005 Dog Dander IgE <0.35 kU/L 2.36 High    D001 D Pteronyssinus IgE <0.35 kU/L <0.35   D002 D Farinae Mite IgE <0.35 kU/L <0.35   T008 Elm IgE <0.35 kU/L <0.35   G10 [place] Grass IgE <0.35 kU/L 0.95 High    T001 Maple/Box Elder IgE T1 <0.35 kU/L <0.35   T070 Mulberry IgE <0.35 kU/L <0.35   T007 Oak IgE <0.35 kU/L 3.45 High    T022 Pecan [place]ory IgE <0.35  kU/L <0.35   M001 Penicillium Notatum IgE <0.35 kU/L <0.35   W014 Pigweed, Rough Common IgE <0.35 kU/L <0.35   W001 Ragweed Common IgE <0.35 kU/L <0.35   I006 Cockroach IgE <0.35 kU/L <0.35   W018 Sheep Sorrel IgE <0.35 kU/L <0.35   G006 Timothy Grass IgE <0.35 kU/L 2.52 High    M003 Aspergullus fumigatus IgE <0.35 kU/L <0.35   M006 Alternaria tenuis IgE <0.35 kU/L <0.35   G002 [place]  Grass IgE <0.35 kU/L 0.68 High    T003 Birch IgE <0.35 kU/L 5.00 High    E001 Cat Dander IgE <0.35 kU/L <0.35   M002 Cladosporium herbarum IgE <0.35 kU/L <0.35   T014 Cottonwood Tree IgE <0.35 kU/L <0.35   T006 [facility] IgE <0.35 kU/L <0.35   E072 Mouse Urine Protein, IgE  <0.35 kU/L <0.35   IgE Total 0 - 114 kU/L 64    Assessment and Plan: Analisia is a 40 y.o. female with: Other adverse food reactions, not elsewhere classified, subsequent encounter Patient endorses multiple food allergies.  Most recently had some facial swelling after eating frozen pineapples.  Symptoms resolved within 30 minutes after taking Benadryl.  Shrimp cause throat tightness and facial swelling in the past.  Tree nuts, mushrooms, soy and Caesar salad dressing cause perioral itching and tongue swelling.  Unknown reaction to avocado.  No prior food allergy work-up. Today's skin testing showed: Negative to select foods. Continue avoidance of pineapple, shellfish, tree nuts, avocado, mushroom, Caesar dressing, soy.  Get bloodwork For mild symptoms you can take over the counter antihistamines such as Benadryl and monitor symptoms closely. If symptoms worsen or if you have severe symptoms including breathing issues, throat closure, significant swelling, whole body hives, severe diarrhea and vomiting, lightheadedness then inject epinephrine and seek immediate medical care afterwards. Emergency action plan given.  Angio-edema Patient is having episodes of facial swelling which resolve fairly quickly.  Not on ACE inhibitor's.  She is concerned about food allergies. Keep track of reactions/swelling episodes. Take pictures. Write down what you had eaten/come across that day. Get bloodwork as below.  Other allergic rhinitis Takes Zyrtec and Singulair daily good benefit.  Complains of itching since off antihistamines.  2020 blood work was positive to dog, grass, tree. Continue environmental control measures. Use over  the counter antihistamines such as Zyrtec (cetirizine), Claritin (loratadine), Allegra (fexofenadine), or Xyzal (levocetirizine) daily as needed. May take twice a day during allergy flares. May switch antihistamines every few months. Continue Singulair (montelukast) 10mg  daily at night. Use Flonase (fluticasone) nasal spray 1 spray per nostril twice a day as needed for nasal congestion.   Asthma Used to follow with a pulmonologist in .  Stable with below regimen. Today's spirometry showed some restriction. Daily controller medication(s): Symbicort [place] 2 puffs twice a day with spacer and rinse mouth afterwards. Continue Singulair (montelukast) 10mg  daily at night. May use albuterol rescue inhaler 2 puffs every 4 to 6 hours as needed for shortness of breath, chest tightness, coughing, and wheezing. May use albuterol rescue inhaler 2 puffs 5 to 15 minutes prior to strenuous physical activities. Monitor frequency of use.   Pruritus Itching since off antihistamines. See below for proper skin care. Take zyrtec 10mg  once a day - twice a day during flares.  Get bloodwork to rule out other etiologies.   Multiple drug allergies Continue to avoid medications on allergy list.  Check latex IgE.  Return in about 3 months (around 05/07/2021).  Meds ordered this encounter  Medications   albuterol (VENTOLIN HFA) 108 (90 Base) MCG/ACT inhaler    Sig: Inhale 2 puffs into the lungs every 4 (four) hours as needed for wheezing or shortness of breath (coughing fits).    Dispense:  18 g    Refill:  1    Lab Orders         Latex, IgE         Allergen Profile, Shellfish         Allergen, Pineapple, f210         Allergen Avocado f96         Allergen, Mushroom, Rf212         IgE Nut Prof. w/Component Rflx         Soybean IgE         Other/Misc lab test         Alpha-Gal Panel         ANA w/Reflex         C1 esterase inhibitor, functional         C1 Esterase Inhibitor         C3 and C4          CBC with Differential/Platelet         Chronic Urticaria         Comprehensive metabolic panel         C-reactive protein         Sedimentation rate         Thyroid Cascade Profile         Tryptase      Other allergy screening: Asthma: yes Patient had a pulmonologist in [place]. Currently on Symbicort 156mcg 2 puffs twice a day x 2 years with good benefit. Using albuterol once a month with good benefit.   Rhino conjunctivitis: yes Patient has itching - worse since off antihistamines.  Currently zyrtec 10mg  and singulair 10mg  daily with good benefit. No prior allergy injections.  Medication allergy: yes Hymenoptera allergy: no Urticaria: yes One time after using clindamycin Eczema:no History of recurrent infections suggestive of immunodeficency: no  Diagnostics: Spirometry:  Tracings reviewed. Her effort: Good reproducible efforts. FVC: 2.16L FEV1: 2.00L, 84% predicted FEV1/FVC ratio: 93% Interpretation: Spirometry consistent with possible restrictive disease.  Please see scanned spirometry results for details.  Skin Testing: Select foods. Negative to select foods. Results discussed with patient/family.  Airborne Adult Perc - 02/04/21 1011     Time Antigen Placed 1011    Panel 1 Select    Comments --   ommited            Food Adult Perc - 02/04/21 1000     Time Antigen Placed 1012    Allergen Manufacturer Lavella Hammock    Location Arm    Number of allergen test 19   Simultaneous filing. User may not have seen previous data.    Control-buffer 50% Glycerol Negative    Control-Histamine 1 mg/ml 2+    8. Shellfish Mix Negative    10. Cashew Negative    11. Pecan Food Negative    12. [place] Negative    13. Almond Negative    14. Hazelnut Negative    15. [place] nut Negative    16. Coconut Negative    17. Pistachio Negative    25. Shrimp Negative    26. Crab Negative    27. Lobster Negative    28. Oyster Negative    29. Scallops Negative    47.  Mushrooms Negative  48. Avocado Negative    63. Pineapple Negative             Past Medical History: Patient Active Problem List   Diagnosis Date Noted   Other adverse food reactions, not elsewhere classified, subsequent encounter 02/05/2021   Pruritus 02/05/2021   Multiple drug allergies 02/05/2021   Angio-edema 02/05/2021   Tinea capitis 12/18/2020   Cardiomegaly 11/29/2020   Carotid bruit [id number]   Systolic murmur [id number]   Idiopathic anaphylaxis 11/29/2020   Chronic right-sided headache 11/29/2020   Cervical radiculopathy 10/04/2020   Ankle swelling, right 07/05/2020   Toenail fungus 07/05/2020   Localized swelling, mass and lump, neck 06/15/2018   Intervertebral disc disorder 06/04/2018   Neck sprain 06/04/2018   Shortness of breath 06/04/2018   Urinary frequency 04/22/2018   Anxiety disorder 03/18/2017   Diverticula of intestine 01/16/2017   Obstructive sleep apnea syndrome 11/11/2016   Hyperlipidemia 04/09/2016   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   Asthma 04/24/2015   Seasonal allergies 04/24/2015   Seborrheic dermatitis 01/16/2014   Electrocardiogram abnormal 06/01/2013   Hypertensive heart disease without congestive heart failure 06/01/2013   Palpitations 06/01/2013   Polycystic ovaries 04/30/2013   Essential hypertension 12/11/2011   Other allergic rhinitis 05/13/2011   Past Medical History:  Diagnosis Date   Allergy    Asthma    Hx of migraines    Hypertension    OSA on CPAP    PCOS (polycystic ovarian syndrome)    Pre-diabetes    Urticaria    Past Surgical History: Past Surgical History:  Procedure Laterality Date   TONSILLECTOMY     UVULOPALATOPHARYNGOPLASTY     Medication List:  Current Outpatient Medications  Medication Sig Dispense Refill   albuterol (VENTOLIN HFA) 108 (90 Base) MCG/ACT inhaler Inhale 2 puffs into the lungs every 4 (four) hours as needed for wheezing or shortness of breath (coughing fits). 18 g 1    budesonide-formoterol (SYMBICORT) 160-4.5 MCG/ACT inhaler Inhale 2 puffs into the lungs as directed. 10.2 g 0   cetirizine (ZYRTEC) 10 MG tablet Take 10 mg by mouth daily.     diazepam (VALIUM) 2 MG tablet Take 1 tablet (2 mg total) by mouth every 6 (six) hours as needed for muscle spasms. 15 tablet 0   EPINEPHrine 0.3 mg/0.3 mL IJ SOAJ injection Inject 0.3 mg into the muscle once.     fluticasone (FLONASE) 50 MCG/ACT nasal spray Place into both nostrils daily.     gabapentin (NEURONTIN) 300 MG capsule Take 1 capsule (300 mg total) by mouth in the morning, at noon, and at bedtime. 120 capsule 2   HYDROcodone-acetaminophen (NORCO/VICODIN) 5-325 MG tablet Take 1-2 tablets by mouth every 4 (four) hours as needed. 10 tablet 0   montelukast (SINGULAIR) 10 MG tablet Take 1 tablet (10 mg total) by mouth at bedtime. 30 tablet 2   gabapentin (NEURONTIN) 300 MG capsule Take 300 mg by mouth 2 (two) times daily.     ketoconazole (NIZORAL) 2 % shampoo Apply to scalp twice a week for 8 weeks and then weekly thereafter (Patient not taking: Reported on 02/04/2021) 120 mL 11   letrozole (FEMARA) 2.5 MG tablet Take 7.5 mg by mouth daily. (Patient not taking: Reported on 01/30/2021)     nitrofurantoin, macrocrystal-monohydrate, (MACROBID) 100 MG capsule Take 1 capsule (100 mg total) by mouth 2 (two) times daily. (Patient not taking: Reported on 01/30/2021) 14 capsule 0   telmisartan-hydrochlorothiazide (MICARDIS HCT) 80-25 MG tablet Take 1 tablet by mouth daily. (Patient  not taking: Reported on 01/30/2021)     No current facility-administered medications for this visit.   Allergies: Allergies  Allergen Reactions   Clindamycin/Lincomycin Hives and Swelling   Gramineae Pollens Shortness Of Breath   Influenza Vaccines Anaphylaxis   Mushroom Extract Complex Swelling   Shellfish Allergy Swelling   Shrimp (Diagnostic) Swelling   Avocado Swelling   Labetalol     Other reaction(s): neurological reaction headache    Latex Itching   Lisinopril Cough    Other reaction(s): neurological reaction headache   Magnesium Other (See Comments)    Neurological reaction   Metformin Hcl Other (See Comments)    Hypoglycemia   Other Swelling   Pineapple Swelling   Sumatriptan Other (See Comments)    Neurological reaction   Social History: Social History   Socioeconomic History   Marital status: Married    Spouse name: Enrique Sack   Number of children: Not on file   Years of education: Not on file   Highest education level: Not on file  Occupational History   Not on file  Tobacco Use   Smoking status: Never   Smokeless tobacco: Never  Vaping Use   Vaping Use: Never used  Substance and Sexual Activity   Alcohol use: Not Currently   Drug use: Never   Sexual activity: Yes    Partners: Male  Other Topics Concern   Not on file  Social History Narrative   Right handed   Social Determinants of Health   Financial Resource Strain: Not on file  Food Insecurity: Not on file  Transportation Needs: Not on file  Physical Activity: Not on file  Stress: Not on file  Social Connections: Not on file   Lives in a house. Smoking: denies Occupation: Therapist, sports HistoryFreight forwarder in the house: no Charity fundraiser in the family room: no Carpet in the bedroom: yes Heating: gas Cooling: central Pet: no  Family History: Family History  Problem Relation Age of Onset   Thyroid disease Mother    Healthy Sister    Hypertension Brother    Healthy Brother    Alzheimer's disease Paternal Grandmother    Parkinsonism Paternal Grandfather    Alzheimer's disease Paternal Grandfather    Problem                               Relation Asthma                                   No  Eczema                                No  Food allergy                          No  Allergic rhino conjunctivitis     No  Review of Systems  Constitutional:  Negative for appetite change, chills, fever and unexpected  weight change.  HENT:  Negative for congestion and rhinorrhea.   Eyes:  Negative for itching.  Respiratory:  Negative for cough, chest tightness, shortness of breath and wheezing.   Cardiovascular:  Negative for chest pain.  Gastrointestinal:  Negative for abdominal pain.  Genitourinary:  Negative for difficulty urinating.  Musculoskeletal:  Positive for neck pain and neck  stiffness.  Skin:  Negative for rash.       itching  Allergic/Immunologic: Positive for environmental allergies.   Objective: BP 122/82   Pulse 86   Temp 98 F (36.7 C) (Temporal)   Resp 16   Ht 5\' 2"  (1.575 m)   Wt 227 lb 3.2 oz (103.1 kg)   SpO2 98%   BMI 41.56 kg/m  Body mass index is 41.56 kg/m. Physical Exam Vitals and nursing note reviewed.  Constitutional:      Appearance: She is well-developed.  HENT:     Head: Normocephalic and atraumatic.     Right Ear: Tympanic membrane and external ear normal.     Left Ear: Tympanic membrane and external ear normal.     Nose: Nose normal.     Mouth/Throat:     Mouth: Mucous membranes are moist.     Pharynx: Oropharynx is clear.  Eyes:     Conjunctiva/sclera: Conjunctivae normal.  Cardiovascular:     Rate and Rhythm: Normal rate and regular rhythm.     Heart sounds: Normal heart sounds. No murmur heard.   No friction rub. No gallop.  Pulmonary:     Effort: Pulmonary effort is normal.     Breath sounds: Normal breath sounds. No wheezing, rhonchi or rales.  Musculoskeletal:     Cervical back: Neck supple.  Skin:    General: Skin is warm.     Findings: No rash.  Neurological:     Mental Status: She is alert and oriented to person, place, and time.  The plan was reviewed with the patient/family, and all questions/concerned were addressed.  It was my pleasure to see Asherah today and participate in her care. Please feel free to contact me with any questions or concerns.  Sincerely,  Rexene Alberts, DO Allergy & Immunology  [facility] office: [place] office: [phone]

## 2021-02-04 NOTE — Patient Instructions (Addendum)
Today's skin testing showed: Negative to select foods.  Food allergies Continue avoidance of pineapple, shellfish, tree nuts, avocado, mushroom, Caesar dressing, soy.  Get bloodwork We are ordering labs, so please allow 1-2 weeks for the results to come back. With the newly implemented Cures Act, the labs might be visible to you at the same time that they become visible to me. However, I will not address the results until all of the results are back, so please be patient.  In the meantime, continue recommendations in your patient instructions, including avoidance measures (if applicable), until you hear from me.  For mild symptoms you can take over the counter antihistamines such as Benadryl and monitor symptoms closely. If symptoms worsen or if you have severe symptoms including breathing issues, throat closure, significant swelling, whole body hives, severe diarrhea and vomiting, lightheadedness then inject epinephrine and seek immediate medical care afterwards. Emergency action plan given.  Swelling: Keep track of reactions/swelling episodes. Take pictures. Write down what you had eaten/come across that day. Get bloodwork.  Itching: See below for proper skin care. Take zyrtec 10mg  once a day - twice a day during flares.   Asthma:  Daily controller medication(s): Symbicort 2 puffs twice a day with spacer and rinse mouth afterwards. Continue Singulair (montelukast) 10mg  daily at night. May use albuterol rescue inhaler 2 puffs every 4 to 6 hours as needed for shortness of breath, chest tightness, coughing, and wheezing. May use albuterol rescue inhaler 2 puffs 5 to 15 minutes prior to strenuous physical activities. Monitor frequency of use.  Asthma control goals:  Full participation in all desired activities (may need albuterol before activity) Albuterol use two times or less a week on average (not counting use with activity) Cough interfering with sleep two times or less a  month Oral steroids no more than once a year No hospitalizations   Environmental allergies: 2020 bloodwork positive to dog, grass, tree pollen. Use over the counter antihistamines such as Zyrtec (cetirizine), Claritin (loratadine), Allegra (fexofenadine), or Xyzal (levocetirizine) daily as needed. May take twice a day during allergy flares. May switch antihistamines every few months. Continue Singulair (montelukast) 10mg  daily at night. Use Flonase (fluticasone) nasal spray 1 spray per nostril twice a day as needed for nasal congestion.   Drug allergies Continue to avoid medications on allergy list.   Follow up in 3 months or sooner if needed.    Reducing Pollen Exposure Pollen seasons: trees (spring), grass (summer) and ragweed/weeds (fall). Keep windows closed in your home and car to lower pollen exposure.  Install air conditioning in the bedroom and throughout the house if possible.  Avoid going out in dry windy days - especially early morning. Pollen counts are highest between 5 - 10 AM and on dry, hot and windy days.  Save outside activities for late afternoon or after a heavy rain, when pollen levels are lower.  Avoid mowing of grass if you have grass pollen allergy. Be aware that pollen can also be transported indoors on people and pets.  Dry your clothes in an automatic dryer rather than hanging them outside where they might collect pollen.  Rinse hair and eyes before bedtime. Pet Allergen Avoidance: Contrary to popular opinion, there are no "hypoallergenic" breeds of dogs or cats. That is because people are not allergic to an animal's hair, but to an allergen found in the animal's saliva, dander (dead skin flakes) or urine. Pet allergy symptoms typically occur within minutes. For some people, symptoms can build up  and become most severe 8 to 12 hours after contact with the animal. People with severe allergies can experience reactions in public places if dander has been transported  on the pet owners' clothing. Keeping an animal outdoors is only a partial solution, since homes with pets in the yard still have higher concentrations of animal allergens. Before getting a pet, ask your allergist to determine if you are allergic to animals. If your pet is already considered part of your family, try to minimize contact and keep the pet out of the bedroom and other rooms where you spend a great deal of time. As with dust mites, vacuum carpets often or replace carpet with a hardwood floor, tile or linoleum. High-efficiency particulate air (HEPA) cleaners can reduce allergen levels over time. While dander and saliva are the source of cat and dog allergens, urine is the source of allergens from rabbits, hamsters, mice and [place] pigs; so ask a non-allergic family member to clean the animal's cage. If you have a pet allergy, talk to your allergist about the potential for allergy immunotherapy (allergy shots). This strategy can often provide long-term relief.  Skin care recommendations  Bath time: Always use lukewarm water. AVOID very hot or cold water. Keep bathing time to 5-10 minutes. Do NOT use bubble bath. Use a mild soap and use just enough to wash the dirty areas. Do NOT scrub skin vigorously.  After bathing, pat dry your skin with a towel. Do NOT rub or scrub the skin.  Moisturizers and prescriptions:  ALWAYS apply moisturizers immediately after bathing (within 3 minutes). This helps to lock-in moisture. Use the moisturizer several times a day over the whole body. Good summer moisturizers include: Aveeno, CeraVe, Cetaphil. Good winter moisturizers include: Aquaphor, Vaseline, Cerave, Cetaphil, Eucerin, Vanicream. When using moisturizers along with medications, the moisturizer should be applied about one hour after applying the medication to prevent diluting effect of the medication or moisturize around where you applied the medications. When not using medications, the  moisturizer can be continued twice daily as maintenance.  Laundry and clothing: Avoid laundry products with added color or perfumes. Use unscented hypo-allergenic laundry products such as Tide free, Cheer free & gentle, and All free and clear.  If the skin still seems dry or sensitive, you can try double-rinsing the clothes. Avoid tight or scratchy clothing such as wool. Do not use fabric softeners or dyer sheets.

## 2021-02-05 DIAGNOSIS — Z889 Allergy status to unspecified drugs, medicaments and biological substances status: Secondary | ICD-10-CM | POA: Insufficient documentation

## 2021-02-05 DIAGNOSIS — T781XXD Other adverse food reactions, not elsewhere classified, subsequent encounter: Secondary | ICD-10-CM | POA: Insufficient documentation

## 2021-02-05 DIAGNOSIS — T783XXA Angioneurotic edema, initial encounter: Secondary | ICD-10-CM | POA: Insufficient documentation

## 2021-02-05 DIAGNOSIS — L299 Pruritus, unspecified: Secondary | ICD-10-CM | POA: Insufficient documentation

## 2021-02-05 NOTE — Assessment & Plan Note (Signed)
Takes Zyrtec and Singulair daily good benefit.  Complains of itching since off antihistamines.  2020 blood work was positive to dog, grass, tree.  Continue environmental control measures. . Use over the counter antihistamines such as Zyrtec (cetirizine), Claritin (loratadine), Allegra (fexofenadine), or Xyzal (levocetirizine) daily as needed. May take twice a day during allergy flares. May switch antihistamines every few months. . Continue Singulair (montelukast) 10mg  daily at night. . Use Flonase (fluticasone) nasal spray 1 spray per nostril twice a day as needed for nasal congestion.

## 2021-02-05 NOTE — Assessment & Plan Note (Signed)
Used to follow with a pulmonologist in [place].  Stable with below regimen.  Today's spirometry showed some restriction. . Daily controller medication(s): Symbicort 2 puffs twice a day with spacer and rinse mouth afterwards. . Continue Singulair (montelukast) 10mg  daily at night. . May use albuterol rescue inhaler 2 puffs every 4 to 6 hours as needed for shortness of breath, chest tightness, coughing, and wheezing. May use albuterol rescue inhaler 2 puffs 5 to 15 minutes prior to strenuous physical activities. Monitor frequency of use.

## 2021-02-05 NOTE — Assessment & Plan Note (Signed)
Patient is having episodes of facial swelling which resolve fairly quickly.  Not on ACE inhibitor's.  She is concerned about food allergies. Marland Kitchen Keep track of reactions/swelling episodes. . Take pictures. . Write down what you had eaten/come across that day. . Get bloodwork as below.

## 2021-02-05 NOTE — Assessment & Plan Note (Addendum)
Itching since off antihistamines. . See below for proper skin care. . Take zyrtec 10mg  once a day - twice a day during flares.  . Get bloodwork to rule out other etiologies.

## 2021-02-05 NOTE — Assessment & Plan Note (Signed)
Patient endorses multiple food allergies.  Most recently had some facial swelling after eating frozen pineapples.  Symptoms resolved within 30 minutes after taking Benadryl.  Shrimp cause throat tightness and facial swelling in the past.  Tree nuts, mushrooms, soy and Caesar salad dressing cause perioral itching and tongue swelling.  Unknown reaction to avocado.  No prior food allergy work-up.  Today's skin testing showed: Negative to select foods.  Continue avoidance of pineapple, shellfish, tree nuts, avocado, mushroom, Caesar dressing, soy.  . Get bloodwork  For mild symptoms you can take over the counter antihistamines such as Benadryl and monitor symptoms closely. If symptoms worsen or if you have severe symptoms including breathing issues, throat closure, significant swelling, whole body hives, severe diarrhea and vomiting, lightheadedness then inject epinephrine and seek immediate medical care afterwards.  Emergency action plan given.

## 2021-02-05 NOTE — Assessment & Plan Note (Addendum)
.   Continue to avoid medications on allergy list.  . Check latex IgE.

## 2021-02-15 ENCOUNTER — Ambulatory Visit
Admission: RE | Admit: 2021-02-15 | Discharge: 2021-02-15 | Disposition: A | Discharge status: Home / Self Care | Payer: No Typology Code available for payment source | Source: Ambulatory Visit | Attending: Neurology | Admitting: Neurology

## 2021-02-15 ENCOUNTER — Other Ambulatory Visit: Payer: Self-pay

## 2021-02-15 DIAGNOSIS — G4485 Primary stabbing headache: Secondary | ICD-10-CM

## 2021-02-15 NOTE — Progress Notes (Signed)
Surgical Instructions    Your procedure is scheduled on Thursday, November 17th, 2022.   Report to [facility] Main Entrance "A" at 08:10 A.M., then check in with the Admitting office.  Call this number if you have problems the morning of surgery:  [phone]   If you have any questions prior to your surgery date call [phone]: Open Monday-Friday 8am-4pm    Remember:  Do not eat or drink after midnight the night before your surgery     Take these medicines the morning of surgery with A SIP OF WATER:  budesonide-formoterol (SYMBICORT) - please, bring the inhaler with you the day of surgery cetirizine (ZYRTEC)  fluticasone (FLONASE) gabapentin (NEURONTIN)   If needed:  albuterol (VENTOLIN HFA) - please, bring the inhaler with you the day of surgery diazepam (VALIUM) EPINEPHrine HYDROcodone-acetaminophen (NORCO/VICODIN)  As of today, STOP taking any Aspirin (unless otherwise instructed by your surgeon) Aleve, Naproxen, Ibuprofen, Motrin, Advil, Goody's, BC's, all herbal medications, fish oil, and all vitamins.   After your COVID test   You are not required to quarantine however you are required to wear a well-fitting mask when you are out and around people not in your household.  If your mask becomes wet or soiled, replace with a new one.  Wash your hands often with soap and water for 20 seconds or clean your hands with an alcohol-based hand sanitizer that contains at least 60% alcohol.  Do not share personal items.  Notify your provider: if you are in close contact with someone who has COVID  or if you develop a fever of 100.4 or greater, sneezing, cough, sore throat, shortness of breath or body aches.    The day of surgery:          Do not wear jewelry or makeup Do not wear lotions, powders, perfumes, or deodorant. Do not shave 48 hours prior to surgery.   Do not bring valuables to the hospital. DO Not wear nail polish, gel polish, artificial nails, or any  other type of covering on natural nails including finger and toenails. If patients have artificial nails, gel coating, etc. that need to be removed by a nail salon, please have this removed prior to surgery or surgery may need to be canceled/delayed if the surgeon/ anesthesia feels like the patient is unable to be adequately monitored.              [facility] is not responsible for any belongings or valuables.  Do NOT Smoke (Tobacco/Vaping)  24 hours prior to your procedure  If you use a CPAP at night, you may bring your mask for your overnight stay.   Contacts, glasses, hearing aids, dentures or partials may not be worn into surgery, please bring cases for these belongings   For patients admitted to the hospital, discharge time will be determined by your treatment team.   Patients discharged the day of surgery will not be allowed to drive home, and someone needs to stay with them for 24 hours.  NO VISITORS WILL BE ALLOWED IN PRE-OP WHERE PATIENTS ARE PREPPED FOR SURGERY.  ONLY 1 SUPPORT PERSON MAY BE PRESENT IN THE WAITING ROOM WHILE YOU ARE IN SURGERY.  IF YOU ARE TO BE ADMITTED, ONCE YOU ARE IN YOUR ROOM YOU WILL BE ALLOWED TWO (2) VISITORS. 1 (ONE) VISITOR MAY STAY OVERNIGHT BUT MUST ARRIVE TO THE ROOM BY 8pm.  Minor children may have two parents present. Special consideration for safety and communication needs will be reviewed  on a case by case basis.  Special instructions:    Oral Hygiene is also important to reduce your risk of infection.  Remember - BRUSH YOUR TEETH THE MORNING OF SURGERY WITH YOUR REGULAR TOOTHPASTE   [facility]- Preparing For Surgery  Before surgery, you can play an important role. Because skin is not sterile, your skin needs to be as free of germs as possible. You can reduce the number of germs on your skin by washing with CHG (chlorahexidine gluconate) Soap before surgery.  CHG is an antiseptic cleaner which kills germs and bonds with the skin to continue  killing germs even after washing.     Please do not use if you have an allergy to CHG or antibacterial soaps. If your skin becomes reddened/irritated stop using the CHG.  Do not shave (including legs and underarms) for at least 48 hours prior to first CHG shower. It is OK to shave your face.  Please follow these instructions carefully.     Shower the NIGHT BEFORE SURGERY and the MORNING OF SURGERY with CHG Soap.   If you chose to wash your hair, wash your hair first as usual with your normal shampoo. After you shampoo, rinse your hair and body thoroughly to remove the shampoo.  Then [employer] and genitals (private parts) with your normal soap and rinse thoroughly to remove soap.  After that Use CHG Soap as you would any other liquid soap. You can apply CHG directly to the skin and wash gently with a scrungie or a clean washcloth.   Apply the CHG Soap to your body ONLY FROM THE NECK DOWN.  Do not use on open wounds or open sores. Avoid contact with your eyes, ears, mouth and genitals (private parts). Wash Face and genitals (private parts)  with your normal soap.   Wash thoroughly, paying special attention to the area where your surgery will be performed.  Thoroughly rinse your body with warm water from the neck down.  DO NOT shower/wash with your normal soap after using and rinsing off the CHG Soap.  Pat yourself dry with a CLEAN TOWEL.  Wear CLEAN PAJAMAS to bed the night before surgery  Place CLEAN SHEETS on your bed the night before your surgery  DO NOT SLEEP WITH PETS.   Day of Surgery:  Take a shower with CHG soap. Wear Clean/Comfortable clothing the morning of surgery Do not apply any deodorants/lotions.   Remember to brush your teeth WITH YOUR REGULAR TOOTHPASTE.   Please read over the following fact sheets that you were given.

## 2021-02-18 ENCOUNTER — Encounter (HOSPITAL_COMMUNITY): Payer: Self-pay

## 2021-02-18 ENCOUNTER — Other Ambulatory Visit: Payer: Self-pay

## 2021-02-18 ENCOUNTER — Encounter (HOSPITAL_COMMUNITY)
Admission: RE | Admit: 2021-02-18 | Discharge: 2021-02-18 | Disposition: A | Discharge status: Home / Self Care | Payer: No Typology Code available for payment source | Source: Ambulatory Visit | Attending: Neurological Surgery | Admitting: Neurological Surgery

## 2021-02-18 VITALS — BP 131/84 | HR 94 | Temp 97.8°F | Resp 18 | Ht 62.0 in | Wt 222.6 lb

## 2021-02-18 DIAGNOSIS — G4733 Obstructive sleep apnea (adult) (pediatric): Secondary | ICD-10-CM | POA: Insufficient documentation

## 2021-02-18 DIAGNOSIS — Z01818 Encounter for other preprocedural examination: Secondary | ICD-10-CM | POA: Diagnosis present

## 2021-02-18 DIAGNOSIS — E876 Hypokalemia: Secondary | ICD-10-CM | POA: Insufficient documentation

## 2021-02-18 DIAGNOSIS — Z20822 Contact with and (suspected) exposure to covid-19: Secondary | ICD-10-CM | POA: Insufficient documentation

## 2021-02-18 DIAGNOSIS — I10 Essential (primary) hypertension: Secondary | ICD-10-CM | POA: Insufficient documentation

## 2021-02-18 DIAGNOSIS — J45909 Unspecified asthma, uncomplicated: Secondary | ICD-10-CM | POA: Insufficient documentation

## 2021-02-18 DIAGNOSIS — E871 Hypo-osmolality and hyponatremia: Secondary | ICD-10-CM | POA: Diagnosis not present

## 2021-02-18 HISTORY — DX: Angina pectoris, unspecified: I20.9

## 2021-02-18 HISTORY — DX: Cardiac murmur, unspecified: R01.1

## 2021-02-18 LAB — BASIC METABOLIC PANEL
Anion gap: 10 (ref 5–15)
BUN: 10 mg/dL (ref 6–20)
CO2: 31 mmol/L (ref 22–32)
Calcium: 9.5 mg/dL (ref 8.9–10.3)
Chloride: 93 mmol/L — ABNORMAL LOW (ref 98–111)
Creatinine, Ser: 0.78 mg/dL (ref 0.44–1.00)
GFR, Estimated: >60 mL/min (ref >60)
Glucose, Bld: 107 mg/dL — ABNORMAL HIGH (ref 70–99)
Potassium: 3.3 mmol/L — ABNORMAL LOW (ref 3.5–5.1)
Sodium: 134 mmol/L — ABNORMAL LOW (ref 135–145)

## 2021-02-18 LAB — CBC
HCT: 45.5 % (ref 36.0–46.0)
Hemoglobin: 15.2 g/dL — ABNORMAL HIGH (ref 12.0–15.0)
MCH: 29.7 pg (ref 26.0–34.0)
MCHC: 33.4 g/dL (ref 30.0–36.0)
MCV: 88.9 fL (ref 80.0–100.0)
Platelets: 296 10*3/uL (ref 150–400)
RBC: 5.12 MIL/uL — ABNORMAL HIGH (ref 3.87–5.11)
RDW: 12.8 % (ref 11.5–15.5)
WBC: 7.4 10*3/uL (ref 4.0–10.5)
nRBC: 0 % (ref 0.0–0.2)

## 2021-02-18 LAB — SARS CORONAVIRUS 2 (TAT 6-24 HRS): SARS Coronavirus 2: NEGATIVE

## 2021-02-18 LAB — SURGICAL PCR SCREEN
MRSA, PCR: NEGATIVE
Staphylococcus aureus: NEGATIVE

## 2021-02-18 LAB — GLUCOSE, CAPILLARY: Glucose-Capillary: 109 mg/dL — ABNORMAL HIGH (ref 70–99)

## 2021-02-18 LAB — HEMOGLOBIN A1C
Hgb A1c MFr Bld: 6.1 % — ABNORMAL HIGH (ref 4.8–5.6)
Mean Plasma Glucose: 128.37 mg/dL

## 2021-02-18 NOTE — Progress Notes (Addendum)
PCP - Joseph Berkshire, MD Cardiologist - Gurney Maxin, MD [facility])  PPM/ICD - denies Device Orders - n/a Rep Notified - n/a  Chest x-ray - n/a EKG - 02/18/2021 Stress Test - 04/25/2015 CE ECHO - 05/04/2015 CE Cardiac Cath - denies  Sleep Study - yes CPAP - yes - between 8 and 9  Pre-diabetes - CBG today 109  Patient is checking CBG very rare; in general CBG between 86 - 120 A1C done in PAT on 02/18/2021  Blood Thinner Instructions: n/a  Aspirin Instructions: Patient was instructed: As of today, STOP taking any Aspirin (unless otherwise instructed by your surgeon) Aleve, Naproxen, Ibuprofen, Motrin, Advil, Goody's, BC's, all herbal medications, fish oil, and all vitamins.    ERAS Protcol - no  COVID TEST- done in PAT on 02/18/2021   Anesthesia review: yes - cardiac history; records requested. History of heart murmur - patient denied any discomfort; no chest pain, no SOB at this time. Abnormal EKG in PAT. WBC 15.2 in PAT (Dr. Jake Samples notified).  Patient denies shortness of breath, fever, cough and chest pain at PAT appointment   All instructions explained to the patient, with a verbal understanding of the material. Patient agrees to go over the instructions while at home for a better understanding. Patient also instructed to self quarantine after being tested for COVID-19. The opportunity to ask questions was provided.

## 2021-02-18 NOTE — Progress Notes (Signed)
Abnormal lab in PAT: WBC 15.2. Dr. Jake Samples office was called with the result. The surgical scheduler. Nicki, was notified.

## 2021-02-19 NOTE — Progress Notes (Signed)
Anesthesia Chart Review:  Pertinent medical history includes HTN, OSA on CPAP, asthma, chronic headache.  In 2017 she had cardiac evaluation for palpitations.  Exercise treadmill test showed good exercise capacity, no ischemia, no arrhythmias.  Echo showed normal LVEF, normal valves, asymmetric moderate proximal septal hypertrophy.  In 2021 she had a coronary calcium scan which showed a total coronary calcium score of 0.  Preop labs reviewed, mild hyponatremia sodium 134, mild hypokalemia with potassium 3.3, labs otherwise unremarkable.  EKG 02/18/2021: NSR.  Rate 85.  Cannot rule out anterior infarct, age undetermined.  Coronary calcium score 05/02/19 (care everywhere): CORONARY ARTERIES: Normal origins.   Calcium Score (Agatston):  LM:    0  LAD:    0  LCx:    0  RCA:    0  ----------------------  Total:    0    IMPRESSION:  1.  Total Coronary Calcium Score (CAC) = 0.   Exercise treadmill test 05/04/15 (care everywhere): Patient exercised for and 54 sec on a standard Bruce protocol, achieving a maximum HR of 184 bpm (98% MPHR) and 12.8 METs, consistent with excellent exercise capacity.  Patient did not experience any chest discomfort during the procedure, and stopped due to reaching target heart rate.  Normal  blood pressure and normal heart rate response to exercise.  No ischemic ST changes noted at rest or with exertion.  No arrhythmias noted.      TTE 04/25/2015: Interpretation Summary  1. Normal LV size. Asymmetric moderate proximal septal hypertrophy (1.6 cm thickness during  end diastole). Normal LV systolic function. LVEF=60-65%.  2. No significant valvular abnormalities.  3. No prior study for comparison.      Zannie Cove [facility] Stay [facility]/Anesthesiology Phone [phone] 02/19/2021 3:52 PM

## 2021-02-19 NOTE — Progress Notes (Signed)
Patient advised of her MRI results.

## 2021-02-19 NOTE — Anesthesia Preprocedure Evaluation (Addendum)
Anesthesia Evaluation  Patient identified by MRN, date of birth, ID band Patient awake    Reviewed: Allergy & Precautions, NPO status , Patient's Chart, lab work & pertinent test results  Airway Mallampati: II  TM Distance: >3 FB Neck ROM: Full    Dental  (+) Teeth Intact, Dental Advisory Given   Pulmonary asthma , sleep apnea and Continuous Positive Airway Pressure Ventilation ,    breath sounds clear to auscultation       Cardiovascular hypertension,  Rhythm:Regular Rate:Normal     Neuro/Psych  Headaches, PSYCHIATRIC DISORDERS Anxiety  Neuromuscular disease    GI/Hepatic negative GI ROS, Neg liver ROS,   Endo/Other  negative endocrine ROS  Renal/GU negative Renal ROS     Musculoskeletal negative musculoskeletal ROS (+)   Abdominal Normal abdominal exam  (+)   Peds  Hematology negative hematology ROS (+)   Anesthesia Other Findings   Reproductive/Obstetrics                           Anesthesia Physical Anesthesia Plan  ASA: 3  Anesthesia Plan: General   Post-op Pain Management:    Induction: Intravenous  PONV Risk Score and Plan: 4 or greater and Ondansetron, Dexamethasone, Midazolam and Scopolamine patch - Pre-op  Airway Management Planned: Oral ETT and Video Laryngoscope Planned  Additional Equipment: None  Intra-op Plan:   Post-operative Plan: Extubation in OR  Informed Consent: I have reviewed the patients History and Physical, chart, labs and discussed the procedure including the risks, benefits and alternatives for the proposed anesthesia with the patient or authorized representative who has indicated his/her understanding and acceptance.     Dental advisory given  Plan Discussed with: CRNA  Anesthesia Plan Comments: (PAT note by Antionette Poles, PA-C: Pertinent medical history includes HTN, OSA on CPAP, asthma, chronic headache.  In 2017 she had cardiac evaluation for  palpitations.  Exercise treadmill test showed good exercise capacity, no ischemia, no arrhythmias.  Echo showed normal LVEF, normal valves, asymmetric moderate proximal septal hypertrophy.  In 2021 she had a coronary calcium scan which showed a total coronary calcium score of 0.  Preop labs reviewed, mild hyponatremia sodium 134, mild hypokalemia with potassium 3.3, labs otherwise unremarkable.  EKG 02/18/2021: NSR.  Rate 85.  Cannot rule out anterior infarct, age undetermined.  Coronary calcium score 05/02/19 (care everywhere): CORONARY ARTERIES: Normal origins.   Calcium Score (Agatston):  LM:  0  LAD:  0  LCx:  0  RCA:  0  ----------------------  Total:  0    IMPRESSION:  1. Total Coronary Calcium Score (CAC) =0.   Exercise treadmill test 05/04/15 (care everywhere): Patient exercised for and 54 sec on a standard Bruce protocol, achieving a maximum HR of 184 bpm (98% MPHR) and 12.8 METs, consistent with excellent exercise capacity. Patient did not experience any chest discomfort during the procedure, and stopped due to reaching target heart rate. Normal blood pressure and normal heart rate response to exercise. No ischemic ST changes noted at rest or with exertion. No arrhythmias noted.    TTE 04/25/2015 (care everywhere): Interpretation Summary  1. Normal LV size. Asymmetric moderate proximal septal hypertrophy (1.6 cm thickness during  end diastole). Normal LV systolic function. LVEF=60-65%.  2. No significant valvular abnormalities.  3. No prior study for comparison.   )      Anesthesia Quick Evaluation

## 2021-02-21 ENCOUNTER — Other Ambulatory Visit: Payer: Self-pay

## 2021-02-21 ENCOUNTER — Encounter (HOSPITAL_COMMUNITY)
Admission: RE | Disposition: A | Discharge status: Home / Self Care | Payer: Self-pay | Source: Ambulatory Visit | Attending: Neurological Surgery

## 2021-02-21 ENCOUNTER — Ambulatory Visit (HOSPITAL_COMMUNITY): Payer: No Typology Code available for payment source | Admitting: Vascular Surgery

## 2021-02-21 ENCOUNTER — Encounter (HOSPITAL_COMMUNITY): Payer: Self-pay | Admitting: Neurological Surgery

## 2021-02-21 ENCOUNTER — Ambulatory Visit (HOSPITAL_COMMUNITY): Payer: No Typology Code available for payment source

## 2021-02-21 ENCOUNTER — Ambulatory Visit (HOSPITAL_COMMUNITY)
Admission: RE | Admit: 2021-02-21 | Discharge: 2021-02-21 | Disposition: A | Discharge status: Home / Self Care | Payer: No Typology Code available for payment source | Source: Ambulatory Visit | Attending: Neurological Surgery | Admitting: Neurological Surgery

## 2021-02-21 ENCOUNTER — Ambulatory Visit (HOSPITAL_COMMUNITY): Payer: No Typology Code available for payment source | Admitting: Certified Registered Nurse Anesthetist

## 2021-02-21 ENCOUNTER — Other Ambulatory Visit (HOSPITAL_COMMUNITY): Payer: Self-pay

## 2021-02-21 DIAGNOSIS — R519 Headache, unspecified: Secondary | ICD-10-CM | POA: Diagnosis not present

## 2021-02-21 DIAGNOSIS — G4730 Sleep apnea, unspecified: Secondary | ICD-10-CM | POA: Insufficient documentation

## 2021-02-21 DIAGNOSIS — M4802 Spinal stenosis, cervical region: Secondary | ICD-10-CM | POA: Insufficient documentation

## 2021-02-21 DIAGNOSIS — M2578 Osteophyte, vertebrae: Secondary | ICD-10-CM | POA: Diagnosis not present

## 2021-02-21 DIAGNOSIS — M50123 Cervical disc disorder at C6-C7 level with radiculopathy: Secondary | ICD-10-CM | POA: Diagnosis not present

## 2021-02-21 DIAGNOSIS — J45909 Unspecified asthma, uncomplicated: Secondary | ICD-10-CM | POA: Insufficient documentation

## 2021-02-21 DIAGNOSIS — R531 Weakness: Secondary | ICD-10-CM | POA: Diagnosis not present

## 2021-02-21 DIAGNOSIS — I10 Essential (primary) hypertension: Secondary | ICD-10-CM | POA: Diagnosis not present

## 2021-02-21 DIAGNOSIS — G709 Myoneural disorder, unspecified: Secondary | ICD-10-CM | POA: Diagnosis not present

## 2021-02-21 DIAGNOSIS — Z419 Encounter for procedure for purposes other than remedying health state, unspecified: Secondary | ICD-10-CM

## 2021-02-21 DIAGNOSIS — F419 Anxiety disorder, unspecified: Secondary | ICD-10-CM | POA: Diagnosis not present

## 2021-02-21 HISTORY — DX: Other complications of anesthesia, initial encounter: T88.59XA

## 2021-02-21 HISTORY — PX: POSTERIOR CERVICAL LAMINECTOMY: SHX2248

## 2021-02-21 LAB — GLUCOSE, CAPILLARY: Glucose-Capillary: 112 mg/dL — ABNORMAL HIGH (ref 70–99)

## 2021-02-21 LAB — POCT PREGNANCY, URINE: Preg Test, Ur: NEGATIVE

## 2021-02-21 SURGERY — POSTERIOR CERVICAL LAMINECTOMY
Anesthesia: General | Laterality: Left

## 2021-02-21 MED ORDER — BUPIVACAINE-EPINEPHRINE 0.5% -1:200000 IJ SOLN
INTRAMUSCULAR | Status: AC
  Filled 2021-02-21: qty 1

## 2021-02-21 MED ORDER — BACITRACIN ZINC 500 UNIT/GM EX OINT
TOPICAL_OINTMENT | CUTANEOUS | Status: AC
  Filled 2021-02-21: qty 28.35

## 2021-02-21 MED ORDER — LIDOCAINE 2% (20 MG/ML) 5 ML SYRINGE
INTRAMUSCULAR | Status: DC | PRN
  Administered 2021-02-21: 40 mg via INTRAVENOUS

## 2021-02-21 MED ORDER — MEPERIDINE HCL 25 MG/ML IJ SOLN: 6.2500 mg | INTRAMUSCULAR | Status: DC | PRN

## 2021-02-21 MED ORDER — LIDOCAINE-EPINEPHRINE 1 %-1:100000 IJ SOLN
INTRAMUSCULAR | Status: AC
  Filled 2021-02-21: qty 1

## 2021-02-21 MED ORDER — CHLORHEXIDINE GLUCONATE CLOTH 2 % EX PADS: 6.0000 | MEDICATED_PAD | Freq: Once | CUTANEOUS | Status: DC

## 2021-02-21 MED ORDER — MUPIROCIN 2 % EX OINT
1.0000 "application " | TOPICAL_OINTMENT | Freq: Once | CUTANEOUS | Status: AC
  Administered 2021-02-21: 1 via TOPICAL
  Filled 2021-02-21: qty 22

## 2021-02-21 MED ORDER — ONDANSETRON HCL 4 MG/2ML IJ SOLN
INTRAMUSCULAR | Status: AC
  Filled 2021-02-21: qty 2

## 2021-02-21 MED ORDER — AMISULPRIDE (ANTIEMETIC) 5 MG/2ML IV SOLN
10.0000 mg | Freq: Once | INTRAVENOUS | Status: AC | PRN
  Administered 2021-02-21: 14:00:00 10 mg via INTRAVENOUS

## 2021-02-21 MED ORDER — THROMBIN 5000 UNITS EX SOLR
OROMUCOSAL | Status: DC | PRN
  Administered 2021-02-21: 11:00:00 5 mL via TOPICAL

## 2021-02-21 MED ORDER — MIDAZOLAM HCL 2 MG/2ML IJ SOLN
INTRAMUSCULAR | Status: AC
  Filled 2021-02-21: qty 2

## 2021-02-21 MED ORDER — AMISULPRIDE (ANTIEMETIC) 5 MG/2ML IV SOLN
INTRAVENOUS | Status: AC
  Filled 2021-02-21: qty 2

## 2021-02-21 MED ORDER — OXYCODONE-ACETAMINOPHEN 10-325 MG PO TABS
1.0000 | ORAL_TABLET | ORAL | 0 refills | Status: DC | PRN
Start: 2021-02-21 — End: 2021-03-20
  Filled 2021-02-21: qty 20, 4d supply, fill #0

## 2021-02-21 MED ORDER — ONDANSETRON HCL 4 MG/2ML IJ SOLN
INTRAMUSCULAR | Status: DC | PRN
  Administered 2021-02-21: 4 mg via INTRAVENOUS

## 2021-02-21 MED ORDER — ACETAMINOPHEN 160 MG/5ML PO SOLN: 325.0000 mg | Freq: Once | ORAL | Status: DC | PRN

## 2021-02-21 MED ORDER — ORAL CARE MOUTH RINSE: 15.0000 mL | Freq: Once | OROMUCOSAL | Status: AC

## 2021-02-21 MED ORDER — ROCURONIUM BROMIDE 10 MG/ML (PF) SYRINGE
PREFILLED_SYRINGE | INTRAVENOUS | Status: AC
  Filled 2021-02-21: qty 10

## 2021-02-21 MED ORDER — HYDROMORPHONE HCL 1 MG/ML IJ SOLN
0.2500 mg | INTRAMUSCULAR | Status: DC | PRN
  Administered 2021-02-21 (×2): 0.25 mg via INTRAVENOUS
  Administered 2021-02-21: 14:00:00 0.5 mg via INTRAVENOUS

## 2021-02-21 MED ORDER — PHENYLEPHRINE HCL-NACL 20-0.9 MG/250ML-% IV SOLN
INTRAVENOUS | Status: DC | PRN
  Administered 2021-02-21: 20 ug/min via INTRAVENOUS

## 2021-02-21 MED ORDER — DEXAMETHASONE SODIUM PHOSPHATE 10 MG/ML IJ SOLN
INTRAMUSCULAR | Status: AC
  Filled 2021-02-21: qty 1

## 2021-02-21 MED ORDER — METHOCARBAMOL 500 MG PO TABS
500.0000 mg | ORAL_TABLET | Freq: Four times a day (QID) | ORAL | 1 refills | Status: DC | PRN
  Filled 2021-02-21: qty 90, 23d supply, fill #0

## 2021-02-21 MED ORDER — THROMBIN 5000 UNITS EX SOLR
CUTANEOUS | Status: AC
  Filled 2021-02-21: qty 15000

## 2021-02-21 MED ORDER — ACETAMINOPHEN 10 MG/ML IV SOLN
1000.0000 mg | Freq: Once | INTRAVENOUS | Status: DC | PRN
  Administered 2021-02-21: 14:00:00 1000 mg via INTRAVENOUS

## 2021-02-21 MED ORDER — FENTANYL CITRATE (PF) 250 MCG/5ML IJ SOLN
INTRAMUSCULAR | Status: AC
  Filled 2021-02-21: qty 5

## 2021-02-21 MED ORDER — FENTANYL CITRATE (PF) 250 MCG/5ML IJ SOLN
INTRAMUSCULAR | Status: DC | PRN
  Administered 2021-02-21: 100 ug via INTRAVENOUS

## 2021-02-21 MED ORDER — MIDAZOLAM HCL 2 MG/2ML IJ SOLN
INTRAMUSCULAR | Status: DC | PRN
  Administered 2021-02-21 (×2): .5 mg via INTRAVENOUS

## 2021-02-21 MED ORDER — BUPIVACAINE LIPOSOME 1.3 % IJ SUSP
INTRAMUSCULAR | Status: AC
  Filled 2021-02-21: qty 20

## 2021-02-21 MED ORDER — DEXAMETHASONE SODIUM PHOSPHATE 10 MG/ML IJ SOLN
INTRAMUSCULAR | Status: DC | PRN
Start: 2021-02-21 — End: 2021-02-21
  Administered 2021-02-21: 10 mg via INTRAVENOUS

## 2021-02-21 MED ORDER — BUPIVACAINE-EPINEPHRINE (PF) 0.5% -1:200000 IJ SOLN
INTRAMUSCULAR | Status: DC | PRN
  Administered 2021-02-21: 5 mL via PERINEURAL

## 2021-02-21 MED ORDER — BACITRACIN 500 UNIT/GM EX OINT
TOPICAL_OINTMENT | CUTANEOUS | Status: DC | PRN
  Administered 2021-02-21: 1 via TOPICAL

## 2021-02-21 MED ORDER — THROMBIN 5000 UNITS EX SOLR
CUTANEOUS | Status: DC | PRN
  Administered 2021-02-21 (×2): 5000 [IU] via TOPICAL

## 2021-02-21 MED ORDER — ACETAMINOPHEN 10 MG/ML IV SOLN
INTRAVENOUS | Status: AC
  Filled 2021-02-21: qty 100

## 2021-02-21 MED ORDER — PROPOFOL 10 MG/ML IV BOLUS
INTRAVENOUS | Status: AC
  Filled 2021-02-21: qty 40

## 2021-02-21 MED ORDER — ROCURONIUM BROMIDE 10 MG/ML (PF) SYRINGE
PREFILLED_SYRINGE | INTRAVENOUS | Status: DC | PRN
  Administered 2021-02-21: 20 mg via INTRAVENOUS
  Administered 2021-02-21: 10 mg via INTRAVENOUS
  Administered 2021-02-21: 50 mg via INTRAVENOUS

## 2021-02-21 MED ORDER — PROMETHAZINE HCL 25 MG/ML IJ SOLN: 6.2500 mg | INTRAMUSCULAR | Status: DC | PRN

## 2021-02-21 MED ORDER — CHLORHEXIDINE GLUCONATE 0.12 % MT SOLN
15.0000 mL | Freq: Once | OROMUCOSAL | Status: AC
  Administered 2021-02-21: 09:00:00 15 mL via OROMUCOSAL
  Filled 2021-02-21: qty 15

## 2021-02-21 MED ORDER — PROPOFOL 10 MG/ML IV BOLUS
INTRAVENOUS | Status: DC | PRN
  Administered 2021-02-21: 70 mg via INTRAVENOUS
  Administered 2021-02-21: 200 mg via INTRAVENOUS

## 2021-02-21 MED ORDER — LIDOCAINE-EPINEPHRINE 1 %-1:100000 IJ SOLN
INTRAMUSCULAR | Status: DC | PRN
  Administered 2021-02-21: 5 mL

## 2021-02-21 MED ORDER — ONDANSETRON HCL 4 MG/2ML IJ SOLN
2.0000 mg | Freq: Once | INTRAMUSCULAR | Status: AC
  Administered 2021-02-21: 14:00:00 2 mg via INTRAVENOUS

## 2021-02-21 MED ORDER — CEFAZOLIN SODIUM-DEXTROSE 2-4 GM/100ML-% IV SOLN
2.0000 g | INTRAVENOUS | Status: AC
  Administered 2021-02-21: 11:00:00 2 g via INTRAVENOUS
  Filled 2021-02-21: qty 100

## 2021-02-21 MED ORDER — ACETAMINOPHEN 325 MG PO TABS: 325.0000 mg | ORAL_TABLET | Freq: Once | ORAL | Status: DC | PRN

## 2021-02-21 MED ORDER — BUPIVACAINE LIPOSOME 1.3 % IJ SUSP
INTRAMUSCULAR | Status: DC | PRN
  Administered 2021-02-21: 20 mL

## 2021-02-21 MED ORDER — LIDOCAINE 2% (20 MG/ML) 5 ML SYRINGE
INTRAMUSCULAR | Status: AC
  Filled 2021-02-21: qty 5

## 2021-02-21 MED ORDER — SCOPOLAMINE 1 MG/3DAYS TD PT72
MEDICATED_PATCH | TRANSDERMAL | Status: AC
  Administered 2021-02-21: 09:00:00 1.5 mg via TRANSDERMAL
  Filled 2021-02-21: qty 1

## 2021-02-21 MED ORDER — METHYLPREDNISOLONE ACETATE 80 MG/ML IJ SUSP
INTRAMUSCULAR | Status: DC | PRN
  Administered 2021-02-21: 40 mg

## 2021-02-21 MED ORDER — ALBUTEROL SULFATE HFA 108 (90 BASE) MCG/ACT IN AERS
INHALATION_SPRAY | RESPIRATORY_TRACT | Status: DC | PRN
  Administered 2021-02-21: 6 via RESPIRATORY_TRACT
  Administered 2021-02-21: 2 via RESPIRATORY_TRACT
  Administered 2021-02-21: 6 via RESPIRATORY_TRACT

## 2021-02-21 MED ORDER — LACTATED RINGERS IV SOLN: INTRAVENOUS | Status: DC

## 2021-02-21 MED ORDER — HYDROMORPHONE HCL 1 MG/ML IJ SOLN
INTRAMUSCULAR | Status: AC
  Filled 2021-02-21: qty 1

## 2021-02-21 MED ORDER — LACTATED RINGERS IV SOLN: INTRAVENOUS | Status: DC | PRN

## 2021-02-21 MED ORDER — 0.9 % SODIUM CHLORIDE (POUR BTL) OPTIME
TOPICAL | Status: DC | PRN
  Administered 2021-02-21: 11:00:00 1000 mL

## 2021-02-21 MED ORDER — METHYLPREDNISOLONE ACETATE 80 MG/ML IJ SUSP
INTRAMUSCULAR | Status: AC
  Filled 2021-02-21: qty 1

## 2021-02-21 MED ORDER — SCOPOLAMINE 1 MG/3DAYS TD PT72: 1.0000 | MEDICATED_PATCH | TRANSDERMAL | Status: DC

## 2021-02-21 MED ORDER — HEMOSTATIC AGENTS (NO CHARGE) OPTIME
TOPICAL | Status: DC | PRN
  Administered 2021-02-21: 1 via TOPICAL

## 2021-02-21 MED ORDER — SUGAMMADEX SODIUM 200 MG/2ML IV SOLN
INTRAVENOUS | Status: DC | PRN
  Administered 2021-02-21 (×2): 100 mg via INTRAVENOUS
  Administered 2021-02-21: 200 mg via INTRAVENOUS

## 2021-02-21 SURGICAL SUPPLY — 74 items
BAG COUNTER SPONGE SURGICOUNT (BAG) ×2 IMPLANT
BAND RUBBER #18 3X1/16 STRL (MISCELLANEOUS) ×4 IMPLANT
BIT DRILL NEURO 2X3.1 SFT TUCH (MISCELLANEOUS) ×1 IMPLANT
BNDG GAUZE ELAST 4 BULKY (GAUZE/BANDAGES/DRESSINGS) IMPLANT
BUR SABER NEURO 2.5 (BURR) IMPLANT
CARTRIDGE OIL MAESTRO DRILL (MISCELLANEOUS) IMPLANT
CNTNR URN SCR LID CUP LEK RST (MISCELLANEOUS) ×1 IMPLANT
CONT SPEC 4OZ STRL OR WHT (MISCELLANEOUS) ×1
COVER BACK TABLE 60X90IN (DRAPES) ×2 IMPLANT
COVER MAYO STAND STRL (DRAPES) ×2 IMPLANT
DECANTER SPIKE VIAL GLASS SM (MISCELLANEOUS) ×2 IMPLANT
DERMABOND ADVANCED (GAUZE/BANDAGES/DRESSINGS) ×1
DERMABOND ADVANCED .7 DNX12 (GAUZE/BANDAGES/DRESSINGS) ×1 IMPLANT
DIFFUSER DRILL AIR PNEUMATIC (MISCELLANEOUS) IMPLANT
DRAIN JACKSON RD 7FR 3/32 (WOUND CARE) IMPLANT
DRAPE C-ARM 42X72 X-RAY (DRAPES) ×2 IMPLANT
DRAPE C-ARMOR (DRAPES) IMPLANT
DRAPE LAPAROTOMY 100X72 PEDS (DRAPES) ×2 IMPLANT
DRAPE MICROSCOPE LEICA (MISCELLANEOUS) ×2 IMPLANT
DRAPE STERI IOBAN 125X83 (DRAPES) IMPLANT
DRAPE SURG 17X23 STRL (DRAPES) IMPLANT
DRILL NEURO 2X3.1 SOFT TOUCH (MISCELLANEOUS) ×2
DRSG OPSITE POSTOP 3X4 (GAUZE/BANDAGES/DRESSINGS) ×2 IMPLANT
DRSG OPSITE POSTOP 4X6 (GAUZE/BANDAGES/DRESSINGS) ×2 IMPLANT
DURAPREP 26ML APPLICATOR (WOUND CARE) ×2 IMPLANT
ELECT BLADE 6.5 EXT (BLADE) IMPLANT
ELECT BLADE INSULATED 4IN (ELECTROSURGICAL) ×2
ELECT BLADE INSULATED 6.5IN (ELECTROSURGICAL)
ELECT REM PT RETURN 9FT ADLT (ELECTROSURGICAL) ×2
ELECTRODE BLADE INSULATED 4IN (ELECTROSURGICAL) ×1 IMPLANT
ELECTRODE BLDE INSULATED 6.5IN (ELECTROSURGICAL) IMPLANT
ELECTRODE REM PT RTRN 9FT ADLT (ELECTROSURGICAL) ×1 IMPLANT
GAUZE 4X4 16PLY ~~LOC~~+RFID DBL (SPONGE) IMPLANT
GAUZE SPONGE 4X4 12PLY STRL (GAUZE/BANDAGES/DRESSINGS) ×2 IMPLANT
GLOVE SRG 8 PF TXTR STRL LF DI (GLOVE) ×1 IMPLANT
GLOVE SURG LTX SZ8 (GLOVE) ×2 IMPLANT
GLOVE SURG POLYISO LF SZ7 (GLOVE) ×2 IMPLANT
GLOVE SURG POLYISO LF SZ8 (GLOVE) ×8 IMPLANT
GLOVE SURG POLYISO LF SZ8.5 (GLOVE) ×2 IMPLANT
GLOVE SURG UNDER POLY LF SZ7.5 (GLOVE) ×4 IMPLANT
GLOVE SURG UNDER POLY LF SZ8 (GLOVE) ×1
GLOVE SURG UNDER POLY LF SZ8.5 (GLOVE) ×2 IMPLANT
GOWN STRL REUS W/ TWL LRG LVL3 (GOWN DISPOSABLE) ×1 IMPLANT
GOWN STRL REUS W/ TWL XL LVL3 (GOWN DISPOSABLE) ×3 IMPLANT
GOWN STRL REUS W/TWL 2XL LVL3 (GOWN DISPOSABLE) ×2 IMPLANT
GOWN STRL REUS W/TWL LRG LVL3 (GOWN DISPOSABLE) ×1
GOWN STRL REUS W/TWL XL LVL3 (GOWN DISPOSABLE) ×3
HEMOSTAT POWDER KIT SURGIFOAM (HEMOSTASIS) ×2 IMPLANT
KIT BASIN OR (CUSTOM PROCEDURE TRAY) ×2 IMPLANT
KIT TURNOVER KIT B (KITS) ×2 IMPLANT
MARKER SKIN DUAL TIP RULER LAB (MISCELLANEOUS) ×4 IMPLANT
NEEDLE BLUNT 18X1 FOR OR ONLY (NEEDLE) ×2 IMPLANT
NEEDLE HYPO 25X1 1.5 SAFETY (NEEDLE) ×2 IMPLANT
NEEDLE SPNL 18GX3.5 QUINCKE PK (NEEDLE) ×4 IMPLANT
NS IRRIG 1000ML POUR BTL (IV SOLUTION) ×2 IMPLANT
OIL CARTRIDGE MAESTRO DRILL (MISCELLANEOUS)
PACK LAMINECTOMY NEURO (CUSTOM PROCEDURE TRAY) ×2 IMPLANT
PAD ARMBOARD 7.5X6 YLW CONV (MISCELLANEOUS) IMPLANT
PATTIES SURGICAL .5 X.5 (GAUZE/BANDAGES/DRESSINGS) IMPLANT
PATTIES SURGICAL .5 X1 (DISPOSABLE) IMPLANT
PATTIES SURGICAL 1X1 (DISPOSABLE) IMPLANT
SPONGE SURGIFOAM ABS GEL SZ50 (HEMOSTASIS) ×2 IMPLANT
SPONGE T-LAP 4X18 ~~LOC~~+RFID (SPONGE) IMPLANT
STAPLER VISISTAT 35W (STAPLE) ×2 IMPLANT
SUT VIC AB 0 CT1 27 (SUTURE) ×1
SUT VIC AB 0 CT1 27XBRD ANBCTR (SUTURE) ×1 IMPLANT
SUT VIC AB 2-0 CP2 18 (SUTURE) ×2 IMPLANT
SUT VIC AB 3-0 SH 8-18 (SUTURE) IMPLANT
SYR 20ML LL LF (SYRINGE) ×2 IMPLANT
SYR 3ML LL SCALE MARK (SYRINGE) ×2 IMPLANT
TOWEL GREEN STERILE (TOWEL DISPOSABLE) ×2 IMPLANT
TOWEL GREEN STERILE FF (TOWEL DISPOSABLE) ×2 IMPLANT
TRAY FOLEY MTR SLVR 16FR STAT (SET/KITS/TRAYS/PACK) IMPLANT
WATER STERILE IRR 1000ML POUR (IV SOLUTION) ×2 IMPLANT

## 2021-02-21 NOTE — Anesthesia Procedure Notes (Signed)
Procedure Name: Intubation Date/Time: 02/21/2021 11:04 AM Performed by: Garfield Cornea, CRNA Pre-anesthesia Checklist: Patient identified, Emergency Drugs available, Suction available and Patient being monitored Patient Re-evaluated:Patient Re-evaluated prior to induction Oxygen Delivery Method: Circle System Utilized Preoxygenation: Pre-oxygenation with 100% oxygen Induction Type: IV induction Ventilation: Mask ventilation without difficulty and Oral airway inserted - appropriate to patient size Laryngoscope Size: Glidescope and 3 Grade View: Grade I Tube type: Oral Tube size: 7.0 mm Number of attempts: 1 Airway Equipment and Method: Oral airway, Rigid stylet and Video-laryngoscopy Placement Confirmation: ETT inserted through vocal cords under direct vision, positive ETCO2 and breath sounds checked- equal and bilateral Secured at: 21 cm Tube secured with: Tape Dental Injury: Teeth and Oropharynx as per pre-operative assessment  Difficulty Due To: Difficulty was anticipated, Difficult Airway- due to reduced neck mobility and Difficult Airway- due to limited oral opening

## 2021-02-21 NOTE — H&P (Signed)
Providing Compassionate, Quality Care - Together  NEUROSURGERY HISTORY & PHYSICAL   Jessica Powell is an 40 y.o. female.   Chief Complaint: Left upper extremity weakness and radiculopathy HPI: This is a pleasant 40 year old female with a history of severe C7 radiculopathy with severe left foraminal stenosis.  She has had progressive weakness as well as severe arm pain due to this.  She has been unresponsive to medication and therapy.  She presents today for left C6-7 foraminotomy.  She denies any neck pain.  She still complains of severe left C7 radiculopathy down her left arm with tricep weakness and grip weakness.  Past Medical History:  Diagnosis Date   Allergy    Anginal pain (HCC)    Asthma    Complication of anesthesia    asthma attack after waking up from EGD in July 2022   Heart murmur    Hx of migraines    Hypertension    OSA on CPAP    PCOS (polycystic ovarian syndrome)    Pre-diabetes    Urticaria     Past Surgical History:  Procedure Laterality Date   TONSILLECTOMY     UVULOPALATOPHARYNGOPLASTY      Family History  Problem Relation Age of Onset   Thyroid disease Mother    Healthy Sister    Hypertension Brother    Healthy Brother    Alzheimer's disease Paternal Grandmother    Parkinsonism Paternal Grandfather    Alzheimer's disease Paternal Grandfather    Social History:  reports that she has never smoked. She has never used smokeless tobacco. She reports that she does not currently use alcohol. She reports that she does not use drugs.  Allergies:  Allergies  Allergen Reactions   Clindamycin/Lincomycin Hives and Swelling   Gramineae Pollens Shortness Of Breath   Influenza Vaccines Anaphylaxis   Mushroom Extract Complex Swelling   Shellfish Allergy Swelling   Shrimp (Diagnostic) Swelling   Avocado Swelling   Labetalol     Other reaction(s): neurological reaction headache   Latex Itching   Lisinopril Cough    Other reaction(s): neurological  reaction headache   Magnesium Other (See Comments)    Neurological reaction   Metformin Hcl Other (See Comments)    Hypoglycemia   Other Itching and Swelling    Ceaser Dressing- Anchovy (tongue swelling & itchy throat)   Pineapple Swelling   Soy Allergy Swelling    Tongue Swelling   Sumatriptan Other (See Comments)    Neurological reaction    Medications Prior to Admission  Medication Sig Dispense Refill   budesonide-formoterol (SYMBICORT) 160-4.5 MCG/ACT inhaler Inhale 2 puffs into the lungs as directed. (Patient taking differently: Inhale 2 puffs into the lungs in the morning and at bedtime.) 10.2 g 0   cetirizine (ZYRTEC) 10 MG tablet Take 10 mg by mouth daily.     diazepam (VALIUM) 2 MG tablet Take 1 tablet (2 mg total) by mouth every 6 (six) hours as needed for muscle spasms. 15 tablet 0   EPINEPHrine 0.3 mg/0.3 mL IJ SOAJ injection Inject 0.3 mg into the muscle as needed for anaphylaxis.     fluticasone (FLONASE) 50 MCG/ACT nasal spray Place 2 sprays into both nostrils daily.     gabapentin (NEURONTIN) 300 MG capsule Take 1 capsule (300 mg total) by mouth in the morning, at noon, and at bedtime. 120 capsule 2   HYDROcodone-acetaminophen (NORCO/VICODIN) 5-325 MG tablet Take 1-2 tablets by mouth every 4 (four) hours as needed. 10 tablet 0  ketoconazole (NIZORAL) 2 % shampoo Apply to scalp twice a week for 8 weeks and then weekly thereafter 120 mL 11   montelukast (SINGULAIR) 10 MG tablet Take 1 tablet (10 mg total) by mouth at bedtime. 30 tablet 2   telmisartan-hydrochlorothiazide (MICARDIS HCT) 80-25 MG tablet Take 1 tablet by mouth daily.     albuterol (VENTOLIN HFA) 108 (90 Base) MCG/ACT inhaler Inhale 2 puffs into the lungs every 4 (four) hours as needed for wheezing or shortness of breath (coughing fits). 18 g 1   nitrofurantoin, macrocrystal-monohydrate, (MACROBID) 100 MG capsule Take 1 capsule (100 mg total) by mouth 2 (two) times daily. (Patient not taking: No sig reported)  14 capsule 0    Results for orders placed or performed during the hospital encounter of 02/21/21 (from the past 48 hour(s))  Pregnancy, urine POC     Status: None   Collection Time: 02/21/21  8:38 AM  Result Value Ref Range   Preg Test, Ur NEGATIVE NEGATIVE    Comment:        THE SENSITIVITY OF THIS METHODOLOGY IS >24 mIU/mL   Glucose, capillary     Status: Abnormal   Collection Time: 02/21/21  8:46 AM  Result Value Ref Range   Glucose-Capillary 112 (H) 70 - 99 mg/dL    Comment: Glucose reference range applies only to samples taken after fasting for at least 8 hours.   No results found.  ROS All pertinent positives and negatives listed in HPI above  Blood pressure (!) 143/91, pulse 92, temperature 97.9 F (36.6 C), temperature source Axillary, resp. rate 18, height 5\' 2"  (1.575 m), weight 100.7 kg, SpO2 95 %. Physical Exam  Awake alert oriented x3, PERRLA Cranial nerves II through XII intact Right upper extremity 5/5 Left upper extremity tricep 3/5, grip 4 -/5, remainder 4+/5 throughout Decreased sensation in the C7 distribution on the left upper extremity Bilateral lower extremities full strength  Assessment/Plan 40 year old female with  Left C7 radiculopathy with weakness due to severe C6-7 foraminal stenosis  -OR today for left C6-7 foraminotomy, hemilaminotomy.  We discussed all risks, benefits and expected outcomes.  Patient would like to proceed with surgical intervention.    Thank you for allowing me to participate in this patient's care.  Please do not hesitate to call with questions or concerns.   24, DO Neurosurgeon [facility] Cell: [phone]

## 2021-02-21 NOTE — Transfer of Care (Signed)
Immediate Anesthesia Transfer of Care Note  Patient: [name]  Procedure(s) Performed: FORAMINOTOMY, Left  Cervical six-seven (Left)  Patient Location: PACU  Anesthesia Type:General  Level of Consciousness: drowsy  Airway & Oxygen Therapy: Patient Spontanous Breathing and Patient connected to face mask oxygen  Post-op Assessment: Report given to RN and Post -op Vital signs reviewed and stable  Post vital signs: Reviewed and stable  Last Vitals:  Vitals Value Taken Time  BP 118/79 02/21/21 1313  Temp    Pulse 103 02/21/21 1320  Resp 28 02/21/21 1320  SpO2 99 % 02/21/21 1320  Vitals shown include unvalidated device data.  Last Pain:  Vitals:   02/21/21 0857  TempSrc:   PainSc: 3          Complications: No notable events documented.

## 2021-02-21 NOTE — Anesthesia Postprocedure Evaluation (Signed)
Anesthesia Post Note  Patient: Jessica Powell  Procedure(s) Performed: FORAMINOTOMY, Left  Cervical six-seven (Left)     Patient location during evaluation: PACU Anesthesia Type: General Level of consciousness: awake and alert Pain management: pain level controlled Vital Signs Assessment: post-procedure vital signs reviewed and stable Respiratory status: spontaneous breathing, nonlabored ventilation, respiratory function stable and patient connected to nasal cannula oxygen Cardiovascular status: blood pressure returned to baseline and stable Postop Assessment: no apparent nausea or vomiting Anesthetic complications: no   No notable events documented.  Last Vitals:  Vitals:   02/21/21 1428 02/21/21 1430  BP:  122/73  Pulse: 97 (!) 103  Resp: 19 20  Temp:    SpO2: 99% 99%    Last Pain:  Vitals:   02/21/21 1428  TempSrc:   PainSc: 6                  Shelton Silvas

## 2021-02-21 NOTE — Progress Notes (Signed)
Wasted 0.5mg  Dilaudid IV in stericycle with Lynetta Mare, RN as witness.

## 2021-02-22 ENCOUNTER — Encounter (HOSPITAL_COMMUNITY): Payer: Self-pay | Admitting: Neurological Surgery

## 2021-02-22 NOTE — Op Note (Signed)
Providing Compassionate, Quality Care - Together  Date of service: 02/21/2021  PREOP DIAGNOSIS:  Left C7 radiculopathy, severe with weakness, due to severe neuroforaminal stenosis left C6-7  POSTOP DIAGNOSIS: Same  PROCEDURE: Left C6-7 hemilaminotomy, foraminotomy for decompression of the left C7 nerve root Intraoperative use of microscope for microdissection Intraoperative use of fluoroscopy  SURGEON: Dr. Kendell Bane C. Lindalee Huizinga, DO  ASSISTANT: Docia Barrier, NP; Georgiann Cocker, RN  ANESTHESIA: General Endotracheal  EBL: 75 cc  SPECIMENS: None  DRAINS: None  COMPLICATIONS: None  CONDITION: Hemodynamically stable  HISTORY: Jessica Powell is a 40 y.o. female with complaints of left upper extremity severe C7 radiculopathy with weakness primarily in her triceps and grip.  Her imaging revealed severe neuroforaminal stenosis on the left at C6-7 due to osteophytic development as well as a small disc protrusion.  She had slight improvement with pain control however given her significant weakness and failure of physical therapy I recommended surgical intervention in the form of a foraminotomies at C6-7 on the left.  We discussed all risks, benefits and expected outcomes and she agreed to proceed.  Informed consent was obtained.  PROCEDURE IN DETAIL: The patient was brought to the operating room. After induction of general anesthesia, the patient was placed in the head holder, [place] and positioned on the operative table in the prone position. All pressure points were meticulously padded. Skin incision was then marked out and prepped and draped in the usual sterile fashion.  Physician driven timeout was performed.  Local anesthetic was injected into the planned incision.  Using a 10 blade, a midline incision over the C6-7 spinous processes was performed.  Midline dissection was continued down to the dorsal fascia.  Using Bovie electrocautery, subperiosteal dissection was performed to expose  the C6 left lamina and left C6-7 facet.  Deep retractors were placed in the wound.  Lateral fluoroscopy confirmed the appropriate level.  The microscope was sterilely draped and brought into the field.  This was used for the remainder the procedure for microdissection.  Using a high-speed drill, a left hemilaminotomy and medial facetectomy was performed.  The C7 nerve root was identified and the epidural space was identified.  This was carefully protected.  A small superior portion of the C7 pedicle was removed with a high-speed drill.  Using Kerrison rongeurs the neuroforamina was further decompressed along the C7 nerve root.  The nerve root appeared quite compressed and once the superior portion of the pedicle was removed, it appeared to expand inferiorly and being more decompressed.  Using micro curettes and micro nerve hook, the ventral region of the nerve root was palpated and there was a small osteophyte and disc herniation that was removed with Kerrison rongeurs and micro curettes.  Epidural hemostasis was achieved with Surgifoam.  Using micro nerve hook, the nerve was followed distally and noted to be adequately decompressed.  The epidural space was copiously irrigated and noted to be excellently hemostatic.  A mixture of Depo-Medrol with Gelfoam was placed over the epidural space.  Deep retractors were taken of the wound and hemostasis was achieved with bipolar cautery.  Fascia and muscle was closed with 0 Vicryl sutures and 2-0 and 3-0 Vicryl sutures were used for the dermis.  Skin was closed with skin glue and sterile dressing was applied.  At the end of the case all sponge, needle, and instrument counts were correct. The patient had the Mayfield head holder removed and was then transferred to the stretcher, extubated, and taken to the  post-anesthesia care unit in stable hemodynamic condition.

## 2021-02-22 NOTE — Addendum Note (Signed)
Addendum  created 02/22/21 0745 by Garfield Cornea, CRNA   Intraprocedure Meds edited

## 2021-02-25 ENCOUNTER — Ambulatory Visit: Payer: BLUE CROSS/BLUE SHIELD | Admitting: Neurology

## 2021-03-07 ENCOUNTER — Other Ambulatory Visit (HOSPITAL_COMMUNITY): Payer: Self-pay

## 2021-03-08 ENCOUNTER — Other Ambulatory Visit (HOSPITAL_COMMUNITY): Payer: Self-pay

## 2021-03-08 MED ORDER — GABAPENTIN 300 MG PO CAPS
ORAL_CAPSULE | ORAL | 1 refills | Status: DC
  Filled 2021-03-08: qty 120, 30d supply, fill #0
  Filled 2021-04-19: qty 120, 30d supply, fill #1

## 2021-03-14 ENCOUNTER — Telehealth: Payer: Self-pay | Admitting: Family Medicine

## 2021-03-14 NOTE — Telephone Encounter (Signed)
Copied from CRM [id number]. Topic: General - Other >> Mar 14, 2021  1:58 PM Marylen Ponto wrote: Reason for CRM: Pt requests call back to advise if her appt on 03/19/21 with Dr. Ashley Royalty could be done virtually. Pt stated she had neck surgery and an hour drive may be too much for her. Cb# [phone]

## 2021-03-14 NOTE — Telephone Encounter (Signed)
Patient wants to know if she can do a VV with Dr. Ashley Royalty because she just had neck surgery and the hour drive would be too much for her.

## 2021-03-18 ENCOUNTER — Telehealth: Payer: Self-pay | Admitting: Family Medicine

## 2021-03-18 NOTE — Telephone Encounter (Signed)
Patient called to see if her in office visit can be a VV. Patient stated that she had neck surgery and is unable to drive an hour to get to the office. Patient would like to know something asap.

## 2021-03-19 ENCOUNTER — Ambulatory Visit: Payer: No Typology Code available for payment source | Admitting: Family Medicine

## 2021-03-19 NOTE — Telephone Encounter (Signed)
Okay to modify visit to virtual.

## 2021-03-20 ENCOUNTER — Other Ambulatory Visit: Payer: Self-pay

## 2021-03-20 ENCOUNTER — Encounter: Payer: Self-pay | Admitting: Family Medicine

## 2021-03-20 ENCOUNTER — Telehealth (INDEPENDENT_AMBULATORY_CARE_PROVIDER_SITE_OTHER): Payer: No Typology Code available for payment source | Admitting: Family Medicine

## 2021-03-20 VITALS — BP 113/73 | HR 86 | Temp 98.1°F | Ht 62.0 in | Wt 230.0 lb

## 2021-03-20 DIAGNOSIS — I10 Essential (primary) hypertension: Secondary | ICD-10-CM

## 2021-03-20 DIAGNOSIS — K5903 Drug induced constipation: Secondary | ICD-10-CM | POA: Diagnosis not present

## 2021-03-20 DIAGNOSIS — E785 Hyperlipidemia, unspecified: Secondary | ICD-10-CM

## 2021-03-20 DIAGNOSIS — R7303 Prediabetes: Secondary | ICD-10-CM | POA: Insufficient documentation

## 2021-03-20 DIAGNOSIS — Z124 Encounter for screening for malignant neoplasm of cervix: Secondary | ICD-10-CM

## 2021-03-20 NOTE — Assessment & Plan Note (Signed)
Reassuring labs on prior work-up, plan to repeat given patient's noted significant weight gain, endorsed poor diet, will obtain prior to follow-up for review.

## 2021-03-20 NOTE — Progress Notes (Signed)
Primary Care / Sports Medicine Virtual Visit  Patient Information:  Patient ID: Jessica Powell, female DOB: August 01, 1980 Age: 40 y.o. MRN: [id number]   Jessica Powell is a pleasant 40 y.o. female presenting with the following:  Chief Complaint  Patient presents with   Follow-up    Review of Systems: No fevers, chills, night sweats, weight loss, chest pain, or shortness of breath.   Patient Active Problem List   Diagnosis Date Noted   Prediabetes 03/20/2021   Drug-induced constipation 03/20/2021   Other adverse food reactions, not elsewhere classified, subsequent encounter 02/05/2021   Pruritus 02/05/2021   Multiple drug allergies 02/05/2021   Angio-edema 02/05/2021   Tinea capitis 12/18/2020   Cardiomegaly 11/29/2020   Carotid bruit 11/29/2020   Systolic murmur 11/29/2020   Idiopathic anaphylaxis 11/29/2020   Chronic right-sided headache 11/29/2020   Cervical radiculopathy 10/04/2020   Ankle swelling, right 07/05/2020   Toenail fungus 07/05/2020   Localized swelling, mass and lump, neck 06/15/2018   Intervertebral disc disorder 06/04/2018   Neck sprain 06/04/2018   Shortness of breath 06/04/2018   Urinary frequency 04/22/2018   Anxiety disorder 03/18/2017   Diverticula of intestine 01/16/2017   Obstructive sleep apnea syndrome 11/11/2016   Hyperlipidemia 04/09/2016   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   Asthma 04/24/2015   Seasonal allergies 04/24/2015   Seborrheic dermatitis 01/16/2014   Electrocardiogram abnormal 06/01/2013   Hypertensive heart disease without congestive heart failure 06/01/2013   Palpitations 06/01/2013   Polycystic ovaries 04/30/2013   Essential hypertension 12/11/2011   Other allergic rhinitis 05/13/2011   Past Medical History:  Diagnosis Date   Allergy    Anginal pain (HCC)    Asthma    Complication of anesthesia    asthma attack after waking up from EGD in July 2022   Heart murmur    Hx of migraines    Hypertension    OSA on CPAP     PCOS (polycystic ovarian syndrome)    Pre-diabetes    Urticaria    Outpatient Encounter Medications as of 03/20/2021  Medication Sig   albuterol (VENTOLIN HFA) 108 (90 Base) MCG/ACT inhaler Inhale 2 puffs into the lungs every 4 (four) hours as needed for wheezing or shortness of breath (coughing fits).   budesonide-formoterol (SYMBICORT) 160-4.5 MCG/ACT inhaler Inhale 2 puffs into the lungs as directed. (Patient taking differently: Inhale 2 puffs into the lungs in the morning and at bedtime.)   cetirizine (ZYRTEC) 10 MG tablet Take 10 mg by mouth daily.   diazepam (VALIUM) 2 MG tablet Take 1 tablet (2 mg total) by mouth every 6 (six) hours as needed for muscle spasms.   EPINEPHrine 0.3 mg/0.3 mL IJ SOAJ injection Inject 0.3 mg into the muscle as needed for anaphylaxis.   gabapentin (NEURONTIN) 300 MG capsule Take 1 capsule by mouth in the morning, 1 capsule by mouth in the afternoon, and 2 capsules in the evening (Patient taking differently: Take 300 mg by mouth at bedtime.)   HYDROcodone-acetaminophen (NORCO/VICODIN) 5-325 MG tablet Take 1 tablet by mouth every 6 (six) hours as needed.   ketoconazole (NIZORAL) 2 % shampoo Apply to scalp twice a week for 8 weeks and then weekly thereafter   montelukast (SINGULAIR) 10 MG tablet Take 1 tablet (10 mg total) by mouth at bedtime.   ondansetron (ZOFRAN) 4 MG tablet Take 4 mg by mouth 2 (two) times daily.   polyethylene glycol (MIRALAX / GLYCOLAX) 17 g packet Take 17 g by mouth daily as needed.  scopolamine (TRANSDERM-SCOP) 1 MG/3DAYS Place 1 patch onto the skin daily.   sodium chloride (OCEAN) 0.65 % SOLN nasal spray Place 1 spray into both nostrils as needed for congestion.   telmisartan-hydrochlorothiazide (MICARDIS HCT) 80-25 MG tablet Take 1 tablet by mouth 3 (three) times a week.   [DISCONTINUED] fluticasone (FLONASE) 50 MCG/ACT nasal spray Place 2 sprays into both nostrils daily.   [DISCONTINUED] gabapentin (NEURONTIN) 300 MG capsule Take 1  capsule (300 mg total) by mouth in the morning, at noon, and at bedtime.   [DISCONTINUED] methocarbamol (ROBAXIN) 500 MG tablet Take 1 tablet (500 mg total) by mouth every 6 (six) hours as needed for muscle spasms.   [DISCONTINUED] nitrofurantoin, macrocrystal-monohydrate, (MACROBID) 100 MG capsule Take 1 capsule (100 mg total) by mouth 2 (two) times daily. (Patient not taking: No sig reported)   [DISCONTINUED] oxyCODONE-acetaminophen (PERCOCET) 10-325 MG tablet Take 1 tablet by mouth every 4 (four) hours as needed for pain.   No facility-administered encounter medications on file as of 03/20/2021.   Past Surgical History:  Procedure Laterality Date   POSTERIOR CERVICAL LAMINECTOMY Left 02/21/2021   Procedure: FORAMINOTOMY, Left  Cervical six-seven;  Surgeon: Dawley, Alan Mulder, DO;  Location: [facility] OR;  Service: Neurosurgery;  Laterality: Left;   TONSILLECTOMY     UVULOPALATOPHARYNGOPLASTY      Virtual Visit via MyChart Video:   I connected with Jessica Powell on 03/20/21 via MyChart Video and verified that I am speaking with the correct person using appropriate identifiers.   The limitations, risks, security and privacy concerns of performing an evaluation and management service by MyChart Video, including the higher likelihood of inaccurate diagnoses and treatments, and the availability of in person appointments were reviewed. The possible need of an additional face-to-face encounter for complete and high quality delivery of care was discussed. The patient was also made aware that there may be a patient responsible charge related to this service. The patient expressed understanding and wishes to proceed.  Provider location is in medical facility. Patient location is at their home, different from provider location. People involved in care of the patient during this telehealth encounter were myself, my nurse/medical assistant, and my front office/scheduling team member.  Objective findings:    General: Speaking full sentences, no audible heavy breathing. Sounds alert and appropriately interactive. Well-appearing. Face symmetric. Extraocular movements intact. Pupils equal and round. No nasal flaring or accessory muscle use visualized.  Independent interpretation of notes and tests performed by another provider:   None  Pertinent History, Exam, Impression, and Recommendations:   No problem-specific Assessment & Plan notes found for this encounter.   Orders & Medications No orders of the defined types were placed in this encounter.  No orders of the defined types were placed in this encounter.    I discussed the above assessment and treatment plan with the patient. The patient was provided an opportunity to ask questions and all were answered. The patient agreed with the plan and demonstrated an understanding of the instructions.   The patient was advised to call back or seek an in-person evaluation if the symptoms worsen or if the condition fails to improve as anticipated.   I provided a total time of 31 minutes including both face-to-face and non-face-to-face time on 03/20/2021 inclusive of time utilized for medical chart review, information gathering, care coordination with staff, and documentation completion.    Jerrol Banana, MD   Primary Care Sports Medicine [facility]

## 2021-03-20 NOTE — Assessment & Plan Note (Signed)
Mild increase in A1c noted, patient states concern over worsened dietary habits and weight gain, plan to recheck this 3 months from previous value, she is obtained this lab prior to follow-up visit for review at that time.

## 2021-03-20 NOTE — Assessment & Plan Note (Signed)
Chronic issue that is stable, adequately controlled on current regimen

## 2021-03-20 NOTE — Assessment & Plan Note (Signed)
Secondary to necessary postoperative pain control regimen, bowel regimen reviewed including continued MiraLAX usage, incorporation of as needed OTC senna-docusate, scheduling medications, mealtimes, bowel habit, and increased hydration.

## 2021-03-20 NOTE — Patient Instructions (Addendum)
-   Recommend trying to adhere to a schedule (pain control, meals, bowel movements) - Can dose Senna-S (senna with docusate) as needed for bowel regimen - 2 month follow-up virtually (

## 2021-03-21 ENCOUNTER — Other Ambulatory Visit: Payer: Self-pay | Admitting: Family Medicine

## 2021-03-21 ENCOUNTER — Other Ambulatory Visit (HOSPITAL_COMMUNITY): Payer: Self-pay

## 2021-03-21 MED ORDER — TELMISARTAN-HCTZ 80-25 MG PO TABS
ORAL_TABLET | ORAL | 0 refills | Status: DC
  Filled 2021-03-21: qty 90, 90d supply, fill #0

## 2021-03-22 ENCOUNTER — Other Ambulatory Visit (HOSPITAL_COMMUNITY): Payer: Self-pay

## 2021-03-22 MED ORDER — BUDESONIDE-FORMOTEROL FUMARATE 160-4.5 MCG/ACT IN AERO
2.0000 | INHALATION_SPRAY | Freq: Two times a day (BID) | RESPIRATORY_TRACT | 2 refills | Status: AC
  Filled 2021-03-22: qty 10.2, 30d supply, fill #0
  Filled 2021-05-07: qty 10.2, 30d supply, fill #1

## 2021-03-22 NOTE — Telephone Encounter (Signed)
Refill if appropriate.  Please advise.  

## 2021-03-22 NOTE — Telephone Encounter (Signed)
Requested medication (s) are due for refill today: yes  Requested medication (s) are on the active medication list: yes  Last refill:  01/29/21  Future visit scheduled: 05/21/21  Notes to clinic:  was last ordered by a provider not in this practice, please assess.   Requested Prescriptions  Pending Prescriptions Disp Refills   budesonide-formoterol (SYMBICORT) 160-4.5 MCG/ACT inhaler 10.2 g 0    Sig: Inhale 2 puffs into the lungs as directed.     Pulmonology:  Combination Products Passed - 03/21/2021  1:51 PM      Passed - Valid encounter within last 12 months    Recent Outpatient Visits           2 days ago Prediabetes   [facility] Jerrol Banana, MD   3 months ago Annual physical exam   [facility] Jerrol Banana, MD   3 months ago Asthma without status asthmaticus without complication, unspecified asthma severity, unspecified whether persistent   [facility] Jerrol Banana, MD       Future Appointments             In 1 month Ellamae Sia, DO [facility] [place]   In 2 months Ashley Royalty, Ocie Bob, MD [facility], [facility]

## 2021-03-25 ENCOUNTER — Other Ambulatory Visit (HOSPITAL_COMMUNITY): Payer: Self-pay

## 2021-03-26 NOTE — Telephone Encounter (Signed)
For your information. Flu shot exemption form printed for review and completion.

## 2021-04-05 ENCOUNTER — Other Ambulatory Visit: Payer: Self-pay | Admitting: Family Medicine

## 2021-04-05 NOTE — Telephone Encounter (Signed)
Requested medication (s) are on the active medication list: yes  Future visit scheduled: 05/21/21  Notes to clinic:  prescriber not at this practice, med not delegated, please assess.  Requested Prescriptions  Pending Prescriptions Disp Refills   diazepam (VALIUM) 2 MG tablet 15 tablet 0    Sig: Take 1 tablet (2 mg total) by mouth every 6 (six) hours as needed for muscle spasms.     Not Delegated - Psychiatry:  Anxiolytics/Hypnotics Failed - 04/05/2021 11:09 PM      Failed - This refill cannot be delegated      Failed - Urine Drug Screen completed in last 360 days      Passed - Valid encounter within last 6 months    Recent Outpatient Visits           2 weeks ago Prediabetes   [facility] Jerrol Banana, MD   3 months ago Annual physical exam   [facility] Jerrol Banana, MD   4 months ago Asthma without status asthmaticus without complication, unspecified asthma severity, unspecified whether persistent   [facility] Jerrol Banana, MD       Future Appointments             In 1 month Ellamae Sia, DO [facility] [place]   In 1 month Ashley Royalty, Ocie Bob, MD [facility], [facility]

## 2021-04-05 NOTE — Telephone Encounter (Signed)
Medication Refill - Medication:  diazepam (VALIUM) 2 MG tablet   Has the patient contacted their pharmacy? Yes.   Contact PCP- pt states the office that normally refills this medication, is already out until Tuesday and pt is needing this medication.   Preferred Pharmacy (with phone number or street name):  [facility] - [place], [place] - [address]  [address] [place], [place] [address]  Phone:  [phone]  Fax:  [phone]    Has the patient been seen for an appointment in the last year OR does the patient have an upcoming appointment? Yes.    Agent: Please be advised that RX refills may take up to 3 business days. We ask that you follow-up with your pharmacy.

## 2021-04-09 NOTE — Telephone Encounter (Signed)
Refill if appropriate.  Please advise.  

## 2021-04-10 ENCOUNTER — Other Ambulatory Visit (HOSPITAL_COMMUNITY): Payer: Self-pay

## 2021-04-19 ENCOUNTER — Other Ambulatory Visit (HOSPITAL_COMMUNITY): Payer: Self-pay

## 2021-05-01 NOTE — Progress Notes (Signed)
GYNECOLOGY ANNUAL PREVENTATIVE CARE ENCOUNTER NOTE  History:     Jessica Powell is a 41 y.o. No obstetric history on file. female here for a routine annual gynecologic exam.    Current complaints:  New onset irregular cycle started after her surgery in November. She does note the preceding 3 periods were not normal for her consisting of lighter periods than typical for her although they came at the expected time frame. She has not had a period since her period the day before her neck surgery.      She also notes vaginal odor. No noticeable itching/burning or discharge.   She is limited in mobility due to her neck surgery for an impinged nerve.   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   She and her husband prior to her surgery have been TTC through [facility]. She has a h/o 17w loss in s/o cervical incompetence some 20 years ago.       Gynecologic History No LMP recorded. (Menstrual status: Irregular Periods). Contraception: none Last Pap: 02/2020 per pt and reports normal.  Last Mammogram: Not yet started  Obstetric History OB History  No obstetric history on file.    Past Medical History:  Diagnosis Date   Allergy    Anginal pain (HCC)    Asthma    Complication of anesthesia    asthma attack after waking up from EGD in July 2022   Heart murmur    Hx of migraines    Hypertension    OSA on CPAP    PCOS (polycystic ovarian syndrome)    Pre-diabetes    Urticaria     Past Surgical History:  Procedure Laterality Date   POSTERIOR CERVICAL LAMINECTOMY Left 02/21/2021   Procedure: FORAMINOTOMY, Left  Cervical six-seven;  Surgeon: Bethann Goo, DO;  Location: [facility] OR;  Service: Neurosurgery;  Laterality: Left;   TONSILLECTOMY     UVULOPALATOPHARYNGOPLASTY      Current Outpatient Medications on File Prior to Visit  Medication Sig Dispense Refill   albuterol (VENTOLIN HFA) 108 (90 Base) MCG/ACT inhaler Inhale 2 puffs into the lungs  every 4 (four) hours as needed for wheezing or shortness of breath (coughing fits). 18 g 1   budesonide-formoterol (SYMBICORT) 160-4.5 MCG/ACT inhaler Inhale 2 puffs into the lungs in the morning and at bedtime. with spacer and rinse mouth afterwards. 10.2 g 2   cetirizine (ZYRTEC) 10 MG tablet Take 10 mg by mouth daily.     diazepam (VALIUM) 2 MG tablet Take 1 tablet (2 mg total) by mouth every 6 (six) hours as needed for muscle spasms. 15 tablet 0   EPINEPHrine 0.3 mg/0.3 mL IJ SOAJ injection Inject 0.3 mg into the muscle as needed for anaphylaxis.     gabapentin (NEURONTIN) 300 MG capsule Take 1 capsule by mouth in the morning, 1 capsule by mouth in the afternoon, and 2 capsules in the evening (Patient taking differently: Take 300 mg by mouth at bedtime.) 120 capsule 1   HYDROcodone-acetaminophen (NORCO/VICODIN) 5-325 MG tablet Take 1 tablet by mouth every 6 (six) hours as needed.     ketoconazole (NIZORAL) 2 % shampoo Apply to scalp twice a week for 8 weeks and then weekly thereafter 120 mL 11   montelukast (SINGULAIR) 10 MG tablet Take 1 tablet (10 mg total) by mouth at bedtime. 30 tablet 2   ondansetron (ZOFRAN) 4 MG tablet Take 4 mg by mouth 2 (two) times  daily.     polyethylene glycol (MIRALAX / GLYCOLAX) 17 g packet Take 17 g by mouth daily as needed.     scopolamine (TRANSDERM-SCOP) 1 MG/3DAYS Place 1 patch onto the skin daily.     sodium chloride (OCEAN) 0.65 % SOLN nasal spray Place 1 spray into both nostrils as needed for congestion.     telmisartan-hydrochlorothiazide (MICARDIS HCT) 80-25 MG tablet Take 1 tablet by mouth 3 (three) times a week.     No current facility-administered medications on file prior to visit.    Allergies  Allergen Reactions   Clindamycin/Lincomycin Hives and Swelling   Gramineae Pollens Shortness Of Breath   Influenza Vaccines Anaphylaxis   Mushroom Extract Complex Swelling   Oxycodone Other (See Comments)    Hallucinations   Shellfish Allergy Swelling    Shrimp (Diagnostic) Swelling   Avocado Swelling   Labetalol     Other reaction(s): neurological reaction headache   Latex Itching   Lisinopril Cough    Other reaction(s): neurological reaction headache   Magnesium Other (See Comments)    Neurological reaction   Metformin Hcl Other (See Comments)    Hypoglycemia   Other Itching and Swelling    Ceaser Dressing- Anchovy (tongue swelling & itchy throat)   Pineapple Swelling   Soy Allergy Swelling    Tongue Swelling   Sumatriptan Other (See Comments)    Neurological reaction    Social History:  reports that she has never smoked. She has never used smokeless tobacco. She reports that she does not currently use alcohol. She reports that she does not use drugs.  Family History  Problem Relation Age of Onset   Thyroid disease Mother    Healthy Sister    Hypertension Brother    Healthy Brother    Alzheimer's disease Paternal Grandmother    Parkinsonism Paternal Grandfather    Alzheimer's disease Paternal Grandfather     The following portions of the patient's history were reviewed and updated as appropriate: allergies, current medications, past family history, past medical history, past social history, past surgical history and problem list.  Review of Systems Pertinent items noted in HPI and remainder of comprehensive ROS otherwise negative.  Physical Exam:  There were no vitals taken for this visit. CONSTITUTIONAL: Well-developed, well-nourished female in no acute distress.  HENT:  Normocephalic, atraumatic, External right and left ear normal.  EYES: Conjunctivae and EOM are normal. Pupils are equal, round, and reactive to light. No scleral icterus.  NECK: Normal range of motion, supple, no masses.  Normal thyroid.  SKIN: Skin is warm and dry. No rash noted. Not diaphoretic. No erythema. No pallor. MUSCULOSKELETAL: Normal range of motion. No tenderness.  No cyanosis, clubbing, or edema. NEUROLOGIC: Alert and oriented to  person, place, and time. Normal reflexes, muscle tone coordination.  PSYCHIATRIC: Normal mood and affect. Normal behavior. Normal judgment and thought content.  CARDIOVASCULAR: Normal heart rate noted, regular rhythm RESPIRATORY: Clear to auscultation bilaterally. Effort and breath sounds normal, no problems with respiration noted.  BREASTS: Symmetric in size. No masses, tenderness, skin changes, nipple drainage, or lymphadenopathy bilaterally. Performed in the presence of a chaperone. ABDOMEN: Soft, no distention noted.  No tenderness, rebound or guarding.  PELVIC: External genitalia normal, Vagina normal without discharge, Urethra without abnormality or discharge, no bladder tenderness, cervix normal in appearance, no CMT, uterus normal size, shape, and consistency, no adnexal masses or tenderness, exam obscured by obesity. Performed in the presence of a chaperone.  Assessment and Plan:  1. Encounter for annual routine gynecological examination - Cervical cancer screening: Discussed guidelines. Pap with HPV done  - Gardasil:  Has not yet had. Would not give while considering TTC - STD Testing: not indicated - Birth Control: Declines. Will desire conception. When ready, she will call for referral to [facility].  - Breast Health: Encouraged self breast awareness/SBE. Teaching provided. Discussed limits of clinical breast exam for detecting breast cancer. Rx given for MXR - F/U 12 months and prn   2. Polycystic ovaries - Discussed endometrial protection with provera getting a period every 1-2 months. Rx sent. Reviewed expectations with when to expect withdrawal bleed.  - If persistent AUB, especially if frequent and heavy, would have low threshold for EMB.  - A1C and FLP managed by PCP  3. Vaginal odor - We will do cultures for this.   Routine preventative health maintenance measures emphasized. Please refer to After Visit Summary for other counseling recommendations.   Milas HockPaula Rane Dumm, MD,  FACOG Obstetrician & Gynecologist, [facility], [facility]

## 2021-05-02 ENCOUNTER — Other Ambulatory Visit (HOSPITAL_COMMUNITY)
Admission: RE | Admit: 2021-05-02 | Discharge: 2021-05-02 | Disposition: A | Discharge status: Home / Self Care | Payer: No Typology Code available for payment source | Source: Ambulatory Visit | Attending: Obstetrics and Gynecology | Admitting: Obstetrics and Gynecology

## 2021-05-02 ENCOUNTER — Other Ambulatory Visit: Payer: Self-pay

## 2021-05-02 ENCOUNTER — Ambulatory Visit (INDEPENDENT_AMBULATORY_CARE_PROVIDER_SITE_OTHER): Payer: No Typology Code available for payment source | Admitting: Obstetrics and Gynecology

## 2021-05-02 ENCOUNTER — Encounter: Payer: Self-pay | Admitting: Obstetrics and Gynecology

## 2021-05-02 VITALS — BP 132/93 | HR 97 | Ht 62.0 in | Wt 232.0 lb

## 2021-05-02 DIAGNOSIS — E282 Polycystic ovarian syndrome: Secondary | ICD-10-CM | POA: Diagnosis not present

## 2021-05-02 DIAGNOSIS — Z01419 Encounter for gynecological examination (general) (routine) without abnormal findings: Secondary | ICD-10-CM

## 2021-05-02 DIAGNOSIS — N898 Other specified noninflammatory disorders of vagina: Secondary | ICD-10-CM | POA: Insufficient documentation

## 2021-05-02 MED ORDER — MEDROXYPROGESTERONE ACETATE 10 MG PO TABS: 10.0000 mg | ORAL_TABLET | Freq: Every day | ORAL | 3 refills | Status: AC

## 2021-05-02 NOTE — Progress Notes (Signed)
Last pap 02/22/20- negative

## 2021-05-03 LAB — CERVICOVAGINAL ANCILLARY ONLY
Bacterial Vaginitis (gardnerella): POSITIVE — AB
Candida Glabrata: NEGATIVE
Candida Vaginitis: NEGATIVE
Comment: NEGATIVE
Comment: NEGATIVE
Comment: NEGATIVE
Comment: NEGATIVE
Trichomonas: NEGATIVE

## 2021-05-03 MED ORDER — METRONIDAZOLE 500 MG PO TABS: 500.0000 mg | ORAL_TABLET | Freq: Two times a day (BID) | ORAL | 0 refills | Status: AC

## 2021-05-03 NOTE — Addendum Note (Signed)
Addended by: Milas Hock A on: 05/03/2021 12:46 PM   Modules accepted: Orders

## 2021-05-04 ENCOUNTER — Encounter: Payer: Self-pay | Admitting: Obstetrics and Gynecology

## 2021-05-06 LAB — CYTOLOGY - PAP
Adequacy: ABSENT
Comment: NEGATIVE
Diagnosis: NEGATIVE
High risk HPV: NEGATIVE

## 2021-05-07 ENCOUNTER — Other Ambulatory Visit (HOSPITAL_COMMUNITY): Payer: Self-pay

## 2021-05-07 ENCOUNTER — Other Ambulatory Visit: Payer: Self-pay | Admitting: Family Medicine

## 2021-05-07 DIAGNOSIS — J45909 Unspecified asthma, uncomplicated: Secondary | ICD-10-CM

## 2021-05-07 MED ORDER — MONTELUKAST SODIUM 10 MG PO TABS
10.0000 mg | ORAL_TABLET | Freq: Every day | ORAL | 2 refills | Status: DC
  Filled 2021-05-07: qty 30, 30d supply, fill #0

## 2021-05-07 NOTE — Telephone Encounter (Signed)
Requested Prescriptions  Pending Prescriptions Disp Refills   montelukast (SINGULAIR) 10 MG tablet 30 tablet 2    Sig: Take 1 tablet (10 mg total) by mouth at bedtime.     Pulmonology:  Leukotriene Inhibitors Passed - 05/07/2021 11:31 AM      Passed - Valid encounter within last 12 months    Recent Outpatient Visits          1 month ago Prediabetes   [facility] Jerrol Banana, MD   4 months ago Annual physical exam   [facility] Jerrol Banana, MD   5 months ago Asthma without status asthmaticus without complication, unspecified asthma severity, unspecified whether persistent   [facility] Jerrol Banana, MD      Future Appointments            Tomorrow Ellamae Sia, DO [facility]   In 2 weeks Ashley Royalty, Ocie Bob, MD [facility], [facility]

## 2021-05-08 ENCOUNTER — Telehealth: Payer: Self-pay | Admitting: Allergy

## 2021-05-08 ENCOUNTER — Ambulatory Visit: Payer: No Typology Code available for payment source | Admitting: Allergy

## 2021-05-08 ENCOUNTER — Encounter: Payer: Self-pay | Admitting: Allergy

## 2021-05-08 NOTE — Telephone Encounter (Signed)
Dr. Selena Batten are there certain labs that you would like ordered for the patient before she follows up with Thurston Hole?

## 2021-05-08 NOTE — Telephone Encounter (Signed)
Labs have been printed and mailed to patients home. Called patient and advised. Patient verbalized understanding.

## 2021-05-08 NOTE — Progress Notes (Deleted)
Follow Up Note  RE: Jessica Powell MRN: [id number] DOB: [birth year] Date of Office Visit: 05/08/2021  Referring provider: Jerrol Banana, MD Primary care provider: Jerrol Banana, MD  Chief Complaint: No chief complaint on file.  History of Present Illness: I had the pleasure of seeing Jessica Powell for a follow up visit at the [facility] on 05/08/2021. She is a 41 y.o. female, who is being followed for adverse food reaction, angioedema, allergic rhinitis, asthma, multiple drug allergies, pruritus. Her previous allergy office visit was on 02/04/2021 with Dr. Selena Batten. Today is a regular follow up visit.  Other adverse food reactions, not elsewhere classified, subsequent encounter Patient endorses multiple food allergies.  Most recently had some facial swelling after eating frozen pineapples.  Symptoms resolved within 30 minutes after taking Benadryl.  Shrimp cause throat tightness and facial swelling in the past.  Tree nuts, mushrooms, soy and Caesar salad dressing cause perioral itching and tongue swelling.  Unknown reaction to avocado.  No prior food allergy work-up. Today's skin testing showed: Negative to select foods. Continue avoidance of pineapple, shellfish, tree nuts, avocado, mushroom, Caesar dressing, soy.  Get bloodwork For mild symptoms you can take over the counter antihistamines such as Benadryl and monitor symptoms closely. If symptoms worsen or if you have severe symptoms including breathing issues, throat closure, significant swelling, whole body hives, severe diarrhea and vomiting, lightheadedness then inject epinephrine and seek immediate medical care afterwards. Emergency action plan given.   Angio-edema Patient is having episodes of facial swelling which resolve fairly quickly.  Not on ACE inhibitor's.  She is concerned about food allergies. Keep track of reactions/swelling episodes. Take pictures. Write down what you had eaten/come across that  day. Get bloodwork as below.   Other allergic rhinitis Takes Zyrtec and Singulair daily good benefit.  Complains of itching since off antihistamines.  2020 blood work was positive to dog, grass, tree. Continue environmental control measures. Use over the counter antihistamines such as Zyrtec (cetirizine), Claritin (loratadine), Allegra (fexofenadine), or Xyzal (levocetirizine) daily as needed. May take twice a day during allergy flares. May switch antihistamines every few months. Continue Singulair (montelukast) 10mg  daily at night. Use Flonase (fluticasone) nasal spray 1 spray per nostril twice a day as needed for nasal congestion.    Asthma Used to follow with a pulmonologist in .  Stable with below regimen. Today's spirometry showed some restriction. Daily controller medication(s): Symbicort [place] 2 puffs twice a day with spacer and rinse mouth afterwards. Continue Singulair (montelukast) 10mg  daily at night. May use albuterol rescue inhaler 2 puffs every 4 to 6 hours as needed for shortness of breath, chest tightness, coughing, and wheezing. May use albuterol rescue inhaler 2 puffs 5 to 15 minutes prior to strenuous physical activities. Monitor frequency of use.    Pruritus Itching since off antihistamines. See below for proper skin care. Take zyrtec 10mg  once a day - twice a day during flares.  Get bloodwork to rule out other etiologies.    Multiple drug allergies Continue to avoid medications on allergy list.  Check latex IgE.   Return in about 3 months (around 05/07/2021).  Assessment and Plan: Jessica Powell is a 41 y.o. female with: No problem-specific Assessment & Plan notes found for this encounter.  No follow-ups on file.  No orders of the defined types were placed in this encounter.  Lab Orders  No laboratory test(s) ordered today    Diagnostics: Spirometry:  Tracings reviewed. Her effort: {Blank single:19197::"Good  reproducible efforts.","It was hard to get  consistent efforts and there is a question as to whether this reflects a maximal maneuver.","Poor effort, data can not be interpreted."} FVC: ***L FEV1: ***L, ***% predicted FEV1/FVC ratio: ***% Interpretation: {Blank single:19197::"Spirometry consistent with mild obstructive disease","Spirometry consistent with moderate obstructive disease","Spirometry consistent with severe obstructive disease","Spirometry consistent with possible restrictive disease","Spirometry consistent with mixed obstructive and restrictive disease","Spirometry uninterpretable due to technique","Spirometry consistent with normal pattern","No overt abnormalities noted given today's efforts"}.  Please see scanned spirometry results for details.  Skin Testing: {Blank single:19197::"Select foods","Environmental allergy panel","Environmental allergy panel and select foods","Food allergy panel","None","Deferred due to recent antihistamines use"}. *** Results discussed with patient/family.   Medication List:  Current Outpatient Medications  Medication Sig Dispense Refill   albuterol (VENTOLIN HFA) 108 (90 Base) MCG/ACT inhaler Inhale 2 puffs into the lungs every 4 (four) hours as needed for wheezing or shortness of breath (coughing fits). 18 g 1   budesonide-formoterol (SYMBICORT) 160-4.5 MCG/ACT inhaler Inhale 2 puffs into the lungs in the morning and at bedtime. with spacer and rinse mouth afterwards. 10.2 g 2   cetirizine (ZYRTEC) 10 MG tablet Take 10 mg by mouth daily.     diazepam (VALIUM) 2 MG tablet Take 1 tablet (2 mg total) by mouth every 6 (six) hours as needed for muscle spasms. 15 tablet 0   EPINEPHrine 0.3 mg/0.3 mL IJ SOAJ injection Inject 0.3 mg into the muscle as needed for anaphylaxis.     gabapentin (NEURONTIN) 300 MG capsule Take 1 capsule by mouth in the morning, 1 capsule by mouth in the afternoon, and 2 capsules in the evening (Patient taking differently: Take 300 mg by mouth at bedtime.) 120 capsule 1    HYDROcodone-acetaminophen (NORCO/VICODIN) 5-325 MG tablet Take 1 tablet by mouth every 6 (six) hours as needed.     ketoconazole (NIZORAL) 2 % shampoo Apply to scalp twice a week for 8 weeks and then weekly thereafter 120 mL 11   medroxyPROGESTERone (PROVERA) 10 MG tablet Take 1 tablet (10 mg total) by mouth daily. For 7 days 21 tablet 3   metroNIDAZOLE (FLAGYL) 500 MG tablet Take 1 tablet (500 mg total) by mouth 2 (two) times daily. 14 tablet 0   montelukast (SINGULAIR) 10 MG tablet Take 1 tablet (10 mg total) by mouth at bedtime. 30 tablet 2   ondansetron (ZOFRAN) 4 MG tablet Take 4 mg by mouth 2 (two) times daily.     polyethylene glycol (MIRALAX / GLYCOLAX) 17 g packet Take 17 g by mouth daily as needed.     scopolamine (TRANSDERM-SCOP) 1 MG/3DAYS Place 1 patch onto the skin daily.     sodium chloride (OCEAN) 0.65 % SOLN nasal spray Place 1 spray into both nostrils as needed for congestion.     telmisartan-hydrochlorothiazide (MICARDIS HCT) 80-25 MG tablet Take 1 tablet by mouth 3 (three) times a week.     No current facility-administered medications for this visit.   Allergies: Allergies  Allergen Reactions   Clindamycin/Lincomycin Hives and Swelling   Gramineae Pollens Shortness Of Breath   Influenza Vaccines Anaphylaxis   Mushroom Extract Complex Swelling   Oxycodone Other (See Comments)    Hallucinations   Shellfish Allergy Swelling   Shrimp (Diagnostic) Swelling   Avocado Swelling   Labetalol     Other reaction(s): neurological reaction headache   Latex Itching   Lisinopril Cough    Other reaction(s): neurological reaction headache   Magnesium Other (See Comments)    Neurological reaction  Metformin Hcl Other (See Comments)    Hypoglycemia   Other Itching and Swelling    Ceaser Dressing- Anchovy (tongue swelling & itchy throat)   Pineapple Swelling   Soy Allergy Swelling    Tongue Swelling   Sumatriptan Other (See Comments)    Neurological reaction   I reviewed  her past medical history, social history, family history, and environmental history and no significant changes have been reported from her previous visit.  Review of Systems  Objective: There were no vitals taken for this visit. There is no height or weight on file to calculate BMI. Physical Exam Previous notes and tests were reviewed. The plan was reviewed with the patient/family, and all questions/concerned were addressed.  It was my pleasure to see Jessica Powell today and participate in her care. Please feel free to contact me with any questions or concerns.  Sincerely,  Wyline Mood, DO Allergy & Immunology  [facility] office: [phone] [facility] office: [phone]

## 2021-05-08 NOTE — Telephone Encounter (Signed)
Patient called and reschedule her appointment for 05/24/2021 with anne, and she would like to go get her blood work done before she comes in. She would like for [place] to mail it or email.[phone]

## 2021-05-21 ENCOUNTER — Other Ambulatory Visit: Payer: Self-pay | Admitting: Allergy

## 2021-05-21 ENCOUNTER — Telehealth: Payer: Self-pay

## 2021-05-21 ENCOUNTER — Telehealth: Payer: No Typology Code available for payment source | Admitting: Family Medicine

## 2021-05-21 NOTE — Telephone Encounter (Signed)
Faxed to [facility] and received confirmation around 9AM.

## 2021-05-21 NOTE — Telephone Encounter (Signed)
Copied from [place] [id number]. Topic: General - Other >> May 21, 2021  8:39 AM Leward Quan A wrote: Reason for CRM: Patient called in to inform Jessica Powell that she is at the lab and need the orders Fax# [phone]  she will be waiting

## 2021-05-22 LAB — LIPID PANEL WITH LDL/HDL RATIO
Cholesterol, Total: 207 mg/dL — ABNORMAL HIGH (ref 100–199)
HDL: 65 mg/dL (ref >39)
LDL Chol Calc (NIH): 122 mg/dL — ABNORMAL HIGH (ref 0–99)
LDL/HDL Ratio: 1.9 ratio (ref 0.0–3.2)
Triglycerides: 113 mg/dL (ref 0–149)
VLDL Cholesterol Cal: 20 mg/dL (ref 5–40)

## 2021-05-22 LAB — HEMOGLOBIN A1C
Est. average glucose Bld gHb Est-mCnc: 131 mg/dL
Hgb A1c MFr Bld: 6.2 % — ABNORMAL HIGH (ref 4.8–5.6)

## 2021-05-24 ENCOUNTER — Encounter: Payer: Self-pay | Admitting: Family Medicine

## 2021-05-24 ENCOUNTER — Ambulatory Visit: Payer: No Typology Code available for payment source | Admitting: Family Medicine

## 2021-05-24 DIAGNOSIS — E785 Hyperlipidemia, unspecified: Secondary | ICD-10-CM

## 2021-05-24 DIAGNOSIS — R7303 Prediabetes: Secondary | ICD-10-CM

## 2021-05-24 NOTE — Telephone Encounter (Signed)
Referral placed to [facility].

## 2021-05-27 ENCOUNTER — Other Ambulatory Visit (HOSPITAL_COMMUNITY): Payer: Self-pay

## 2021-05-27 LAB — IGE NUT PROF. W/COMPONENT RFLX

## 2021-05-27 MED ORDER — METHOCARBAMOL 750 MG PO TABS
ORAL_TABLET | ORAL | 2 refills | Status: DC
  Filled 2021-05-27 (×2): qty 90, 30d supply, fill #0

## 2021-05-27 MED ORDER — GABAPENTIN 300 MG PO CAPS
ORAL_CAPSULE | ORAL | 1 refills | Status: DC
  Filled 2021-05-27: qty 120, 24d supply, fill #0

## 2021-05-28 ENCOUNTER — Ambulatory Visit: Payer: No Typology Code available for payment source | Attending: Neurosurgery | Admitting: Physical Therapy

## 2021-05-28 ENCOUNTER — Telehealth: Payer: Self-pay

## 2021-05-28 ENCOUNTER — Other Ambulatory Visit: Payer: Self-pay

## 2021-05-28 ENCOUNTER — Encounter: Payer: Self-pay | Admitting: Physical Therapy

## 2021-05-28 ENCOUNTER — Telehealth (INDEPENDENT_AMBULATORY_CARE_PROVIDER_SITE_OTHER): Payer: No Typology Code available for payment source | Admitting: Family Medicine

## 2021-05-28 ENCOUNTER — Encounter: Payer: Self-pay | Admitting: Family Medicine

## 2021-05-28 VITALS — BP 144/73 | HR 105 | Ht 62.0 in | Wt 228.0 lb

## 2021-05-28 DIAGNOSIS — M6281 Muscle weakness (generalized): Secondary | ICD-10-CM | POA: Diagnosis present

## 2021-05-28 DIAGNOSIS — M5412 Radiculopathy, cervical region: Secondary | ICD-10-CM | POA: Diagnosis not present

## 2021-05-28 DIAGNOSIS — E785 Hyperlipidemia, unspecified: Secondary | ICD-10-CM | POA: Diagnosis not present

## 2021-05-28 DIAGNOSIS — I10 Essential (primary) hypertension: Secondary | ICD-10-CM | POA: Diagnosis not present

## 2021-05-28 DIAGNOSIS — R7303 Prediabetes: Secondary | ICD-10-CM | POA: Diagnosis not present

## 2021-05-28 NOTE — Assessment & Plan Note (Deleted)
Progressively worsening A1c numbers over the past several months.  Patient does like this to lifestyle, is amenable for referral to dietitian/nutritionist.  This has been placed in her system, we can plan to recheck A1c in 3 months time. °

## 2021-05-28 NOTE — Therapy (Signed)
[facility] [address] [place], [place], [address] Phone: [phone]   Fax:  [phone]  Physical Therapy Evaluation  Patient Details  Name: Jessica Powell MRN: [id number] Date of Birth: 1980-09-11 Referring Provider (PT): [facility] [place], [place]   Encounter Date: 05/28/2021   PT End of Session - 05/28/21 0907     Visit Number 1    Number of Visits 12    Date for PT Re-Evaluation 06/25/21    PT Start Time 0810    PT Stop Time 0858    PT Time Calculation (min) 48 min    Activity Tolerance Patient tolerated treatment well    Behavior During Therapy [facility] for tasks assessed/performed             Past Medical History:  Diagnosis Date   Allergy    Anginal pain (HCC)    Asthma    Complication of anesthesia    asthma attack after waking up from EGD in July 2022   Heart murmur    Hx of migraines    Hypertension    OSA on CPAP    PCOS (polycystic ovarian syndrome)    Pre-diabetes    Urticaria     Past Surgical History:  Procedure Laterality Date   POSTERIOR CERVICAL LAMINECTOMY Left 02/21/2021   Procedure: FORAMINOTOMY, Left  Cervical six-seven;  Surgeon: Bethann Goo, DO;  Location: [facility] OR;  Service: Neurosurgery;  Laterality: Left;   TONSILLECTOMY     UVULOPALATOPHARYNGOPLASTY      There were no vitals filed for this visit.    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 Pt had cervical foraminectomy 02/21/2021. Since injury pt has had Lt UE weakness which improved with surgery but is not at full strength. Pt had PT prior to surgery but not after surgery. Pt has been out of work since September. Pt has pain in her lower cervical spine radiating to Lt scapula and Lt UE. Pain increases with Lt shoulder extension and lifting, driving. Pain decreases with ice, meds.    Pertinent History cervical surgery 02/2021    Limitations Lifting     Patient Stated Goals decrease pain and improve Lt UE strength    Currently in Pain? Yes    Pain Score 3     Pain Location Neck    Pain Orientation Posterior;Lower    Pain Descriptors / Indicators Pressure    Pain Type Surgical pain;Chronic pain    Pain Radiating Towards Lt UE    Pain Onset More than a month ago    Pain Frequency Intermittent    Aggravating Factors  lift, activity, reach    Pain Relieving Factors ice, meds    Effect of Pain on Daily Activities unable to sleep in her bed, unable to work                [facility] PT Assessment - 05/28/21 0001       Assessment   Medical Diagnosis cervical radiculopathy    Referring Provider (PT) MCDANIEL, JOSHUA    Onset Date/Surgical Date 02/21/21    Hand Dominance Right    Prior Therapy 12/2020 prior to cervical surgery      Precautions   Precautions None      Restrictions   Weight Bearing Restrictions No      Balance Screen   Has the patient fallen in the past 6 months No  Prior Chief Operating Officer, currently unable to work due to injury      Observation/Other Assessments   Focus on Therapeutic Outcomes (FOTO)  63      Sensation   Additional Comments impaired sensation Lt UE      ROM / Strength   AROM / PROM / Strength AROM;Strength      AROM   AROM Assessment Site Cervical    Cervical Flexion 38    Cervical Extension 15    Cervical - Right Side Bend 40    Cervical - Left Side Bend 30    Cervical - Right Rotation 45    Cervical - Left Rotation 45      Strength   Overall Strength Comments Rt shoulder grossly 5/5, Lt grossly 3-/5    Strength Assessment Site Hand    Right/Left hand Right;Left    Right Hand Grip (lbs) 55    Left Hand Grip (lbs) 12      Palpation   Spinal mobility unable to assess due to pain    Palpation comment TTP and hypersenstive Lt upper traps, scapular mm, Lt shoulder mm, TTP cervical paraspinals                        Objective measurements  completed on examination: See above findings.       [facility] Adult PT Treatment/Exercise - 05/28/21 0001       Exercises   Exercises Shoulder      Shoulder Exercises: Supine   Flexion AAROM;5 reps      Shoulder Exercises: Seated   Other Seated Exercises scap squeeze x 10      Shoulder Exercises: Standing   ABduction AAROM;5 reps      Shoulder Exercises: ROM/Strengthening   Pendulum all directions x 10      Modalities   Modalities Cryotherapy      Cryotherapy   Number Minutes Cryotherapy 10 Minutes    Cryotherapy Location Shoulder    Type of Cryotherapy Ice pack                     PT Education - 05/28/21 0841     Education Details PT POC and goals, HEP    Person(s) Educated Patient    Methods Explanation;Demonstration;Handout    Comprehension Returned demonstration;Verbalized understanding                 PT Long Term Goals - 05/28/21 0913       PT LONG TERM GOAL #1   Title Pt will be independent in HEP    Time 4    Period Weeks    Status New    Target Date 06/25/21      PT LONG TERM GOAL #2   Title Pt will improve FOTO to >= 68 to demo improved functional mobility    Time 4    Period Weeks    Status New    Target Date 06/25/21      PT LONG TERM GOAL #3   Title Pt will improve LT shoulder strength to 4/5 to improve ability to complete ADLs and IADLs    Time 4    Period Weeks    Status New    Target Date 06/25/21      PT LONG TERM GOAL #4   Title Pt will improve LT grip strength to >= 30lbs to progress to return to work    Time 4  Period Weeks    Status New    Target Date 06/25/21                    Plan - 05/28/21 0908     Clinical Impression Statement Pt is a 41 y/o female s/p cervical surgery 02/2021 referred for cervical radiculopathy. Pt presents with impaired strength and sensation, decreased ROM and functional mobility and will benefit from skilled PT to address deficits and improve functional mobility.     Personal Factors and Comorbidities Time since onset of injury/illness/exacerbation;Past/Current Experience    Examination-Activity Limitations Hygiene/Grooming;Bathing;Carry;Reach Overhead    Examination-Participation Restrictions Cleaning;Community Activity;Driving;Occupation;Meal Prep;Yard Work    Stability/Clinical Decision Making Evolving/Moderate complexity    Clinical Decision Making Moderate    Rehab Potential Good    PT Frequency 3x / week    PT Duration 4 weeks    PT Treatment/Interventions Cryotherapy;Aquatic Therapy;Electrical Stimulation;Traction;Neuromuscular re-education;Therapeutic exercise;Therapeutic activities;Patient/family education;Manual techniques;Passive range of motion;Dry needling;Taping;Vasopneumatic Device    PT Next Visit Plan assess and progress HEP (pt only has visits until end of the month), shoulder strength and ROM    PT Home Exercise Plan [email or web address]    Consulted and Agree with Plan of Care Patient             Patient will benefit from skilled therapeutic intervention in order to improve the following deficits and impairments:  Pain, Decreased strength, Decreased activity tolerance, Decreased range of motion, Decreased mobility, Impaired UE functional use, Impaired sensation  Visit Diagnosis: Muscle weakness (generalized) - Plan: PT plan of care cert/re-cert  Radiculopathy, cervical region - Plan: PT plan of care cert/re-cert     Problem List Patient Active Problem List   Diagnosis Date Noted   Prediabetes 03/20/2021   Drug-induced constipation 03/20/2021   Other adverse food reactions, not elsewhere classified, subsequent encounter 02/05/2021   Pruritus 02/05/2021   Multiple drug allergies 02/05/2021   Angio-edema 02/05/2021   Tinea capitis 12/18/2020   Cardiomegaly 11/29/2020   Carotid bruit 11/29/2020   Systolic murmur 11/29/2020   Idiopathic anaphylaxis 11/29/2020   Chronic right-sided headache 11/29/2020   Cervical radiculopathy  10/04/2020   Ankle swelling, right 07/05/2020   Toenail fungus 07/05/2020   Localized swelling, mass and lump, neck 06/15/2018   Intervertebral disc disorder 06/04/2018   Neck sprain 06/04/2018   Shortness of breath 06/04/2018   Urinary frequency 04/22/2018   Anxiety disorder 03/18/2017   Diverticula of intestine 01/16/2017   Obstructive sleep apnea syndrome 11/11/2016   Hyperlipidemia 04/09/2016   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 [place], [place], [address] Phone: [phone]   Fax:  [phone]  Name: Jessica Powell MRN: [id number] Date of Birth: 05-Dec-1980

## 2021-05-28 NOTE — Patient Instructions (Addendum)
-   Start dosing blood pressure medication (Micardis HCT) daily - Can continue to monitor blood pressure at home, contact us for any low readings/symptoms - Virtual MyChart follow-up in 6 weeks

## 2021-05-28 NOTE — Patient Instructions (Signed)
Access Code: [email or web address] URL: [email or web address] Date: 05/28/2021 Prepared by: [employer]  Exercises Circular Shoulder Pendulum with Table Support - 1 x daily - 7 x weekly - 3 sets - 10 reps Horizontal Shoulder Pendulum with Table Support - 1 x daily - 7 x weekly - 3 sets - 10 reps Flexion-Extension Shoulder Pendulum with Table Support - 1 x daily - 7 x weekly - 3 sets - 10 reps Seated Scapular Retraction - 1 x daily - 7 x weekly - 2 sets - 10 reps Supine Shoulder Flexion Extension AAROM with Dowel - 1 x daily - 7 x weekly - 2 sets - 10 reps Standing Shoulder Abduction AAROM with Dowel - 1 x daily - 7 x weekly - 2 sets - 10 reps

## 2021-05-28 NOTE — Telephone Encounter (Signed)
Please schedule 6 week follow up.

## 2021-05-28 NOTE — Assessment & Plan Note (Signed)
Progressively worsening A1c numbers over the past several months.  Patient does like this to lifestyle, is amenable for referral to dietitian/nutritionist.  This has been placed in her system, we can plan to recheck A1c in 3 months time.

## 2021-05-28 NOTE — Assessment & Plan Note (Signed)
Progressive increase in total cholesterol, LDL, and noted AST elevation can be attributed to this.  A referral to dietitian/nutritionist has been placed.

## 2021-05-28 NOTE — Telephone Encounter (Signed)
-----   Message from Jerrol Banana, MD sent at 05/28/2021 12:20 PM EST ----- Regarding: 6 week video visit Patient needs 6 week MyChart video visit for follow-up

## 2021-05-28 NOTE — Assessment & Plan Note (Signed)
Patient with longstanding hypertension, previous noted to have well control.  Patient attributes previous well-controlled despite sporadic dosing of her Micardis HCT 80-25 mg to her comorbid usage of Valium.  She has since discontinued Valium and today's blood pressure is noted to be elevated.  She is asymptomatic from a cardiopulmonary standpoint, continues to dose medication essentially as needed if she checks her blood pressure and it is noted to be elevated.  I discussed the appropriate dosing of this medication, relation of adequate blood pressure management to her medical conditions, and she is amenable to transitioning this to daily dosing.  We will coordinate a follow-up in 6 weeks to assess the need for titration, and she was advised to contact us for any issues/barriers noted.  Chronic condition, uncontrolled, Rx management

## 2021-05-28 NOTE — Progress Notes (Signed)
Primary Care / Sports Medicine Virtual Visit  Patient Information:  Patient ID: [place], female DOB: 1980-12-03 Age: 41 y.o. MRN: [id number]   Denayah Sherrick is a pleasant 42 y.o. female presenting with the following:  Chief Complaint  Patient presents with   Prediabetes   Hyperlipidemia   Hypertension    Review of Systems: No fevers, chills, night sweats, weight loss, chest pain, or shortness of breath.   Patient Active Problem List   Diagnosis Date Noted   Prediabetes 03/20/2021   Drug-induced constipation 03/20/2021   Other adverse food reactions, not elsewhere classified, subsequent encounter 02/05/2021   Pruritus 02/05/2021   Multiple drug allergies 02/05/2021   Angio-edema 02/05/2021   Tinea capitis 12/18/2020   Cardiomegaly 11/29/2020   Carotid bruit 11/29/2020   Systolic murmur 11/29/2020   Idiopathic anaphylaxis 11/29/2020   Chronic right-sided headache 11/29/2020   Cervical radiculopathy 10/04/2020   Ankle swelling, right 07/05/2020   Toenail fungus 07/05/2020   Localized swelling, mass and lump, neck 06/15/2018   Intervertebral disc disorder 06/04/2018   Shortness of breath 06/04/2018   Urinary frequency 04/22/2018   Anxiety disorder 03/18/2017   Diverticula of intestine 01/16/2017   Obstructive sleep apnea syndrome 11/11/2016   Hyperlipidemia 04/09/2016   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   Asthma 04/24/2015   Seasonal allergies 04/24/2015   Seborrheic dermatitis 01/16/2014   Electrocardiogram abnormal 06/01/2013   Hypertensive heart disease without congestive heart failure 06/01/2013   Palpitations 06/01/2013   Polycystic ovaries 04/30/2013   Essential hypertension 12/11/2011   Other allergic rhinitis 05/13/2011   Past Medical History:  Diagnosis Date   Allergy    Anginal pain (HCC)    Asthma    Complication of anesthesia    asthma attack after waking up from EGD in July 2022   Heart murmur    Hx of migraines    Neck sprain 06/04/2018    OSA on CPAP    PCOS (polycystic ovarian syndrome)    Pre-diabetes    Urticaria    Outpatient Encounter Medications as of 05/28/2021  Medication Sig   albuterol (VENTOLIN HFA) 108 (90 Base) MCG/ACT inhaler Inhale 2 puffs into the lungs every 4 (four) hours as needed for wheezing or shortness of breath (coughing fits).   budesonide-formoterol (SYMBICORT) 160-4.5 MCG/ACT inhaler Inhale 2 puffs into the lungs in the morning and at bedtime. with spacer and rinse mouth afterwards.   cetirizine (ZYRTEC) 10 MG tablet Take 10 mg by mouth daily.   EPINEPHrine 0.3 mg/0.3 mL IJ SOAJ injection Inject 0.3 mg into the muscle as needed for anaphylaxis.   gabapentin (NEURONTIN) 300 MG capsule Take 1 capsule by mouth 3 times a day with 2 capsules during the evening dose   HYDROcodone-acetaminophen (NORCO/VICODIN) 5-325 MG tablet Take 0.5 tablets by mouth every 6 (six) hours as needed for moderate pain.   ketoconazole (NIZORAL) 2 % shampoo Apply to scalp twice a week for 8 weeks and then weekly thereafter   medroxyPROGESTERone (PROVERA) 10 MG tablet Take 1 tablet (10 mg total) by mouth daily. For 7 days   methocarbamol (ROBAXIN) 750 MG tablet Take 1 tablet by mouth 3 times every day   metroNIDAZOLE (FLAGYL) 500 MG tablet Take 1 tablet (500 mg total) by mouth 2 (two) times daily.   montelukast (SINGULAIR) 10 MG tablet Take 1 tablet (10 mg total) by mouth at bedtime.   polyethylene glycol (MIRALAX / GLYCOLAX) 17 g packet Take 17 g by mouth daily as needed.  sodium chloride (OCEAN) 0.65 % SOLN nasal spray Place 1 spray into both nostrils as needed for congestion.   [DISCONTINUED] telmisartan-hydrochlorothiazide (MICARDIS HCT) 80-25 MG tablet Take 1 tablet by mouth 3 (three) times a week.   telmisartan-hydrochlorothiazide (MICARDIS HCT) 80-25 MG tablet Take 1 tablet by mouth daily.   [DISCONTINUED] diazepam (VALIUM) 2 MG tablet Take 1 tablet (2 mg total) by mouth every 6 (six) hours as needed for muscle spasms.  (Patient taking differently: Take 4 mg by mouth at bedtime as needed for muscle spasms.)   [DISCONTINUED] methylPREDNISolone (MEDROL DOSEPAK) 4 MG TBPK tablet Take by mouth.   [DISCONTINUED] ondansetron (ZOFRAN) 4 MG tablet Take 4 mg by mouth 2 (two) times daily.   [DISCONTINUED] scopolamine (TRANSDERM-SCOP) 1 MG/3DAYS Place 1 patch onto the skin daily.   No facility-administered encounter medications on file as of 05/28/2021.   Past Surgical History:  Procedure Laterality Date   POSTERIOR CERVICAL LAMINECTOMY Left 02/21/2021   Procedure: FORAMINOTOMY, Left  Cervical six-seven;  Surgeon: Dawley, Alan Mulder, DO;  Location: [facility] OR;  Service: Neurosurgery;  Laterality: Left;   TONSILLECTOMY     UVULOPALATOPHARYNGOPLASTY      Virtual Visit via MyChart Video:   I connected with Manning Charity on 05/28/21 via MyChart Video and verified that I am speaking with the correct person using appropriate identifiers.   The limitations, risks, security and privacy concerns of performing an evaluation and management service by MyChart Video, including the higher likelihood of inaccurate diagnoses and treatments, and the availability of in person appointments were reviewed. The possible need of an additional face-to-face encounter for complete and high quality delivery of care was discussed. The patient was also made aware that there may be a patient responsible charge related to this service. The patient expressed understanding and wishes to proceed.  Provider location is in medical facility. Patient location is at their home, different from provider location. People involved in care of the patient during this telehealth encounter were myself, my nurse/medical assistant, and my front office/scheduling team member.  Objective findings:   General: Speaking full sentences, no audible heavy breathing. Sounds alert and appropriately interactive. Well-appearing. Face symmetric. Extraocular movements intact. Pupils  equal and round. No nasal flaring or accessory muscle use visualized.  Independent interpretation of notes and tests performed by another provider:   None  Pertinent History, Exam, Impression, and Recommendations:   Prediabetes Progressively worsening A1c numbers over the past several months.  Patient does like this to lifestyle, is amenable for referral to dietitian/nutritionist.  This has been placed in her system, we can plan to recheck A1c in 3 months time.  Hyperlipidemia Progressive increase in total cholesterol, LDL, and noted AST elevation can be attributed to this.  A referral to dietitian/nutritionist has been placed.  Essential hypertension Patient with longstanding hypertension, previous noted to have well control.  Patient attributes previous well-controlled despite sporadic dosing of her Micardis HCT 80-25 mg to her comorbid usage of Valium.  She has since discontinued Valium and today's blood pressure is noted to be elevated.  She is asymptomatic from a cardiopulmonary standpoint, continues to dose medication essentially as needed if she checks her blood pressure and it is noted to be elevated.  I discussed the appropriate dosing of this medication, relation of adequate blood pressure management to her medical conditions, and she is amenable to transitioning this to daily dosing.  We will coordinate a follow-up in 6 weeks to assess the need for titration, and she was advised  to contact us for any issues/barriers noted.  Chronic condition, uncontrolled, Rx management  Orders & Medications No orders of the defined types were placed in this encounter.  No orders of the defined types were placed in this encounter.    I discussed the above assessment and treatment plan with the patient. The patient was provided an opportunity to ask questions and all were answered. The patient agreed with the plan and demonstrated an understanding of the instructions.   The patient was advised  to call back or seek an in-person evaluation if the symptoms worsen or if the condition fails to improve as anticipated.   I provided a total time of 37 minutes including both face-to-face and non-face-to-face time on 05/28/2021 inclusive of time utilized for medical chart review, information gathering, care coordination with staff, and documentation completion.  Specifically reviewed recent lab work via screen share associated interpretation, relation to current medical history, interventions including medication management, and the need for appropriate follow-up.    Jerrol Banana, MD   Primary Care Sports Medicine [facility]

## 2021-05-29 LAB — TRYPTASE: Tryptase: 4.8 ug/L (ref 2.2–13.2)

## 2021-05-29 LAB — IGE NUT PROF. W/COMPONENT RFLX
F017-IgE Hazelnut (Filbert): 0.76 kU/L — AB
F018-IgE Brazil Nut: <0.1 kU/L
F020-IgE Almond: <0.1 kU/L
F202-IgE Cashew Nut: <0.1 kU/L
F203-IgE Pistachio Nut: <0.1 kU/L
F256-IgE Walnut: <0.1 kU/L
Macadamia Nut, IgE: <0.1 kU/L
Peanut, IgE: <0.1 kU/L
Pecan Nut IgE: <0.1 kU/L

## 2021-05-29 LAB — CBC WITH DIFFERENTIAL/PLATELET
Basophils Absolute: 0.1 10*3/uL (ref 0.0–0.2)
Basos: 1 %
EOS (ABSOLUTE): 0.1 10*3/uL (ref 0.0–0.4)
Eos: 1 %
Hematocrit: 43.9 % (ref 34.0–46.6)
Hemoglobin: 14.9 g/dL (ref 11.1–15.9)
Immature Grans (Abs): 0 10*3/uL (ref 0.0–0.1)
Immature Granulocytes: 0 %
Lymphocytes Absolute: 2.2 10*3/uL (ref 0.7–3.1)
Lymphs: 31 %
MCH: 30.1 pg (ref 26.6–33.0)
MCHC: 33.9 g/dL (ref 31.5–35.7)
MCV: 89 fL (ref 79–97)
Monocytes Absolute: 0.6 10*3/uL (ref 0.1–0.9)
Monocytes: 8 %
Neutrophils Absolute: 4.2 10*3/uL (ref 1.4–7.0)
Neutrophils: 59 %
Platelets: 357 10*3/uL (ref 150–450)
RBC: 4.95 x10E6/uL (ref 3.77–5.28)
RDW: 14.4 % (ref 11.7–15.4)
WBC: 7.1 10*3/uL (ref 3.4–10.8)

## 2021-05-29 LAB — COMPREHENSIVE METABOLIC PANEL
ALT: 51 IU/L — ABNORMAL HIGH (ref 0–32)
AST: 35 IU/L (ref 0–40)
Albumin/Globulin Ratio: 1.7 (ref 1.2–2.2)
Albumin: 4.9 g/dL — ABNORMAL HIGH (ref 3.8–4.8)
Alkaline Phosphatase: 75 IU/L (ref 44–121)
BUN/Creatinine Ratio: 8 — ABNORMAL LOW (ref 9–23)
BUN: 8 mg/dL (ref 6–20)
Bilirubin Total: 0.6 mg/dL (ref 0.0–1.2)
CO2: 22 mmol/L (ref 20–29)
Calcium: 9.8 mg/dL (ref 8.7–10.2)
Chloride: 98 mmol/L (ref 96–106)
Creatinine, Ser: 0.95 mg/dL (ref 0.57–1.00)
Globulin, Total: 2.9 g/dL (ref 1.5–4.5)
Glucose: 109 mg/dL — ABNORMAL HIGH (ref 70–99)
Potassium: 4.1 mmol/L (ref 3.5–5.2)
Sodium: 137 mmol/L (ref 134–144)
Total Protein: 7.8 g/dL (ref 6.0–8.5)
eGFR: 78 mL/min/{1.73_m2} (ref >59)

## 2021-05-29 LAB — ALLERGEN PROFILE, SHELLFISH
Clam IgE: <0.1 kU/L
F023-IgE Crab: <0.1 kU/L
F080-IgE Lobster: <0.1 kU/L
F290-IgE Oyster: <0.1 kU/L
Scallop IgE: <0.1 kU/L
Shrimp IgE: <0.1 kU/L

## 2021-05-29 LAB — ALLERGEN SOYBEAN: Soybean IgE: <0.1 kU/L

## 2021-05-29 LAB — ALLERGEN, PINEAPPLE, F210: Pineapple IgE: <0.1 kU/L

## 2021-05-29 LAB — ALPHA-GAL PANEL
Allergen Lamb IgE: <0.1 kU/L
Beef IgE: <0.1 kU/L
IgE (Immunoglobulin E), Serum: 54 IU/mL (ref 6–495)
O215-IgE Alpha-Gal: <0.1 kU/L
Pork IgE: <0.1 kU/L

## 2021-05-29 LAB — PANEL 604726
Cor A 1 IgE: 1.29 kU/L — AB
Cor A 14 IgE: <0.1 kU/L
Cor A 8 IgE: <0.1 kU/L
Cor A 9 IgE: <0.1 kU/L

## 2021-05-29 LAB — SEDIMENTATION RATE: Sed Rate: 15 mm/hr (ref 0–32)

## 2021-05-29 LAB — C1 ESTERASE INHIBITOR: C1INH SerPl-mCnc: 33 mg/dL (ref 21–39)

## 2021-05-29 LAB — CHRONIC URTICARIA: cu index: 3.4 (ref <10)

## 2021-05-29 LAB — ALLERGEN AVOCADO F96: F096-IgE Avocado: <0.1 kU/L

## 2021-05-29 LAB — ALLERGEN COMPONENT COMMENTS

## 2021-05-29 LAB — THYROID CASCADE PROFILE: TSH: 2.41 u[IU]/mL (ref 0.450–4.500)

## 2021-05-29 LAB — ALLERGEN, MUSHROOM, RF212: Mushroom IgE: <0.1 kU/L

## 2021-05-29 LAB — F313-IGE ANCHOVY: F313-IgE Anchovy: <0.1 kU/L

## 2021-05-29 LAB — LATEX, IGE: Latex IgE: <0.1 kU/L

## 2021-05-29 LAB — C-REACTIVE PROTEIN: CRP: 8 mg/L (ref 0–10)

## 2021-05-29 LAB — ANA W/REFLEX: Anti Nuclear Antibody (ANA): NEGATIVE

## 2021-05-29 LAB — C1 ESTERASE INHIBITOR, FUNCTIONAL: C1INH Functional/C1INH Total MFr SerPl: 89 %mean normal

## 2021-05-29 LAB — C3 AND C4
Complement C3, Serum: 178 mg/dL — ABNORMAL HIGH (ref 82–167)
Complement C4, Serum: 42 mg/dL — ABNORMAL HIGH (ref 12–38)

## 2021-05-30 ENCOUNTER — Ambulatory Visit: Payer: No Typology Code available for payment source | Admitting: Rehabilitative and Restorative Service Providers"

## 2021-05-30 ENCOUNTER — Other Ambulatory Visit: Payer: Self-pay

## 2021-05-30 ENCOUNTER — Encounter: Payer: Self-pay | Admitting: Rehabilitative and Restorative Service Providers"

## 2021-05-30 DIAGNOSIS — M5412 Radiculopathy, cervical region: Secondary | ICD-10-CM

## 2021-05-30 DIAGNOSIS — M6281 Muscle weakness (generalized): Secondary | ICD-10-CM

## 2021-05-30 DIAGNOSIS — M542 Cervicalgia: Secondary | ICD-10-CM

## 2021-05-30 DIAGNOSIS — R293 Abnormal posture: Secondary | ICD-10-CM

## 2021-05-30 NOTE — Therapy (Signed)
[facility] [place] [address] [place], [place], [address] Phone: [phone]   Fax:  [phone]  Physical Therapy Treatment  Patient Details  Name: Jessica Powell MRN: [id number] Date of Birth: 12-21-1980 Referring Provider (PT): [facility] [place], [place]   Encounter Date: 05/30/2021   PT End of Session - 05/30/21 1627     Visit Number 2    Number of Visits 12    Date for PT Re-Evaluation 06/25/21    PT Start Time 1600    PT Stop Time 1700    PT Time Calculation (min) 60 min    Activity Tolerance Patient tolerated treatment well             Past Medical History:  Diagnosis Date   Allergy    Anginal pain (HCC)    Asthma    Complication of anesthesia    asthma attack after waking up from EGD in July 2022   Heart murmur    Hx of migraines    Neck sprain 06/04/2018   OSA on CPAP    PCOS (polycystic ovarian syndrome)    Pre-diabetes    Urticaria     Past Surgical History:  Procedure Laterality Date   POSTERIOR CERVICAL LAMINECTOMY Left 02/21/2021   Procedure: FORAMINOTOMY, Left  Cervical six-seven;  Surgeon: Bethann Goo, DO;  [facility]: [facility];  Service: Neurosurgery;  Laterality: Left;   TONSILLECTOMY     UVULOPALATOPHARYNGOPLASTY      There were no vitals filed for this visit.   Subjective Assessment - 05/30/21 1727     Subjective Already feeling some better. Less tightness and pain. Working on her exercises and "getting out of my comfort zone"    Currently in Pain? Yes    Pain Score 4     Pain Location Neck                               [facility] Adult PT Treatment/Exercise - 05/30/21 0001       Self-Care   Other Self-Care Comments  myofacial ball release work standing      Therapeutic Activites    Therapeutic Activities Other Therapeutic Activities    Other Therapeutic Activities education re-desentization activities      Shoulder Exercises: Seated   Row AROM;Strengthening;Both;5  reps;Theraband    Theraband Level (Shoulder Row) Level 2 (Red)    Row Limitations PT holding TB    External Rotation AROM;Strengthening;Both;5 reps;Theraband    Theraband Level (Shoulder External Rotation) Level 2 (Red)    Other Seated Exercises scap squeeze x 10; posterior shoulder rolls x 10    Other Seated Exercises lateral cervical flexion 3-5 sec hold x 3 reps each side; gentle trunk rotation sitting arms at side x 3 reps each side      Shoulder Exercises: Standing   Other Standing Exercises scap squeeze with noodle 5 sec x 10 reps      Shoulder Exercises: ROM/Strengthening   Other ROM/Strengthening Exercises AAROM Lt index finger MP; PIP; DIP; composite fist x 5 reps each x 10 sec hold followed by sponge squeeze and AROM fisting including index finger      Cryotherapy   Number Minutes Cryotherapy 10 Minutes    Cryotherapy Location Shoulder;Lumbar Spine    Type of Cryotherapy Ice pack                     PT Education - 05/30/21 1721  Education Details HEP; suggestioins for modifying recliner/sleeping positions    Person(s) Educated Patient    Methods Explanation;Demonstration;Tactile cues;Verbal cues;Handout    Comprehension Verbalized understanding;Returned demonstration;Verbal cues required;Tactile cues required                 PT Long Term Goals - 05/28/21 0913       PT LONG TERM GOAL #1   Title Pt will be independent in HEP    Time 4    Period Weeks    Status New    Target Date 06/25/21      PT LONG TERM GOAL #2   Title Pt will improve FOTO to >= 68 to demo improved functional mobility    Time 4    Period Weeks    Status New    Target Date 06/25/21      PT LONG TERM GOAL #3   Title Pt will improve LT shoulder strength to 4/5 to improve ability to complete ADLs and IADLs    Time 4    Period Weeks    Status New    Target Date 06/25/21      PT LONG TERM GOAL #4   Title Pt will improve LT grip strength to >= 30lbs to progress to return  to work    Time 4    Period Weeks    Status New    Target Date 06/25/21                   Plan - 05/30/21 1639     Clinical Impression Statement Reviewed HEP and added exercises, myofacial release, desentization and deep breathing. Discussed modification of recliner and gradual transition into the bed to sleep.    Rehab Potential Good    PT Frequency 3x / week    PT Duration 4 weeks    PT Treatment/Interventions Cryotherapy;Aquatic Therapy;Electrical Stimulation;Traction;Neuromuscular re-education;Therapeutic exercise;Therapeutic activities;Patient/family education;Manual techniques;Passive range of motion;Dry needling;Taping;Vasopneumatic Device    PT Next Visit Plan assess and progress HEP (pt only has visits until end of the month), shoulder strength and ROM    PT Home Exercise Plan [email or web address]    Consulted and Agree with Plan of Care Patient             Patient will benefit from skilled therapeutic intervention in order to improve the following deficits and impairments:     Visit Diagnosis: Muscle weakness (generalized)  Radiculopathy, cervical region  Cervicalgia  Abnormal posture     Problem List Patient Active Problem List   Diagnosis Date Noted   Prediabetes 03/20/2021   Drug-induced constipation 03/20/2021   Other adverse food reactions, not elsewhere classified, subsequent encounter 02/05/2021   Pruritus 02/05/2021   Multiple drug allergies 02/05/2021   Angio-edema 02/05/2021   Tinea capitis 12/18/2020   Cardiomegaly 11/29/2020   Carotid bruit 11/29/2020   Systolic murmur 11/29/2020   Idiopathic anaphylaxis 11/29/2020   Chronic right-sided headache 11/29/2020   Cervical radiculopathy 10/04/2020   Ankle swelling, right 07/05/2020   Toenail fungus 07/05/2020   Localized swelling, mass and lump, neck 06/15/2018   Intervertebral disc disorder 06/04/2018   Shortness of breath 06/04/2018   Urinary frequency 04/22/2018   Anxiety disorder  03/18/2017   Diverticula of intestine 01/16/2017   Obstructive sleep apnea syndrome 11/11/2016   Hyperlipidemia 04/09/2016   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   Asthma 04/24/2015   Seasonal allergies 04/24/2015   Seborrheic dermatitis 01/16/2014   Electrocardiogram abnormal 06/01/2013   Hypertensive heart disease without congestive heart failure  06/01/2013   Palpitations 06/01/2013   Polycystic ovaries 04/30/2013   Essential hypertension 12/11/2011   Other allergic rhinitis 05/13/2011    Donique Hammonds Rober Minion, PT, MPH 05/30/2021, 5:46 PM  [facility] [address] [place], [place], [address] Phone: [phone]   Fax:  [phone]  Name: Jessica Powell MRN: [id number] Date of Birth: 1980/09/17

## 2021-05-30 NOTE — Patient Instructions (Signed)
Access Code: [email or web address] URL: [email or web address] Date: 05/30/2021 Prepared by: Corlis Leak  Exercises Circular Shoulder Pendulum with Table Support - 1 x daily - 7 x weekly - 3 sets - 10 reps Horizontal Shoulder Pendulum with Table Support - 1 x daily - 7 x weekly - 3 sets - 10 reps Flexion-Extension Shoulder Pendulum with Table Support - 1 x daily - 7 x weekly - 3 sets - 10 reps Seated Scapular Retraction - 1 x daily - 7 x weekly - 2 sets - 10 reps Supine Shoulder Flexion Extension AAROM with Dowel - 1 x daily - 7 x weekly - 2 sets - 10 reps Standing Shoulder Abduction AAROM with Dowel - 1 x daily - 7 x weekly - 2 sets - 10 reps Seated Cervical Retraction - 2 x daily - 7 x weekly - 1-2 sets - 5-10 reps - 10 sec hold Standing Scapular Retraction - 3 x daily - 7 x weekly - 1 sets - 10 reps - 10 sec hold Scapular Retraction with Resistance - 2 x daily - 7 x weekly - 1 sets - 3 reps - 30 sec hold Shoulder External Rotation and Scapular Retraction with Resistance - 2 x daily - 7 x weekly - 1 sets - 10 reps - 3-5 sec hold Seated Trunk Rotation - Hands on Shoulders - 2 x daily - 7 x weekly - 1 sets - 3 reps - 3-5 sec hold Seated Cervical Sidebending AROM - 2 x daily - 7 x weekly - 1 sets - 5 reps - 5-10 sec hold Standing Pectoral Release with Ball at Wall - 3-4 x daily - 7 x weekly Standing Infraspinatus/Teres Minor Release with Ball at Wall - 2 x daily - 7 x weekly

## 2021-05-31 ENCOUNTER — Ambulatory Visit: Payer: No Typology Code available for payment source | Admitting: Physical Therapy

## 2021-05-31 DIAGNOSIS — R293 Abnormal posture: Secondary | ICD-10-CM

## 2021-05-31 DIAGNOSIS — M6281 Muscle weakness (generalized): Secondary | ICD-10-CM | POA: Diagnosis not present

## 2021-05-31 DIAGNOSIS — M542 Cervicalgia: Secondary | ICD-10-CM

## 2021-05-31 DIAGNOSIS — M5412 Radiculopathy, cervical region: Secondary | ICD-10-CM

## 2021-05-31 NOTE — Therapy (Signed)
[facility] [place] [address] [place], [place], [address] Phone: [phone]   Fax:  [phone]  Physical Therapy Treatment  Patient Details  Name: Jessica Powell MRN: [id number] Date of Birth: 04-Jun-1980 Referring Provider (PT): MCDANI[place], [place]   Encounter Date: 05/31/2021   PT End of Session - 05/31/21 1019     Visit Number 3    Number of Visits 12    Date for PT Re-Evaluation 06/25/21    PT Start Time 1019    PT Stop Time 1100    PT Time Calculation (min) 41 min    Activity Tolerance Patient tolerated treatment well    Behavior During Therapy [facility] for tasks assessed/performed             Past Medical History:  Diagnosis Date   Allergy    Anginal pain (HCC)    Asthma    Complication of anesthesia    asthma attack after waking up from EGD in July 2022   Heart murmur    Hx of migraines    Neck sprain 06/04/2018   OSA on CPAP    PCOS (polycystic ovarian syndrome)    Pre-diabetes    Urticaria     Past Surgical History:  Procedure Laterality Date   POSTERIOR CERVICAL LAMINECTOMY Left 02/21/2021   Procedure: FORAMINOTOMY, Left  Cervical six-seven;  Surgeon: Bethann Goo, DO;  Location: [facility] OR;  Service: Neurosurgery;  Laterality: Left;   TONSILLECTOMY     UVULOPALATOPHARYNGOPLASTY      There were no vitals filed for this visit.   Subjective Assessment - 05/31/21 1021     Subjective Pt reports she was able to sleep in her bed for the first time last night. Pt notes she is a little sore from last session.    Pertinent History cervical surgery 02/2021    Limitations Lifting    Patient Stated Goals decrease pain and improve Lt UE strength    Currently in Pain? Yes    Pain Score 4     Pain Location Scapula    Pain Orientation Left                               [facility] Adult PT Treatment/Exercise - 05/31/21 0001       Shoulder Exercises: Seated   Row AROM;Strengthening;Both;5  reps;Theraband    Theraband Level (Shoulder Row) Level 2 (Red)    External Rotation AROM;Strengthening;Both;5 reps;Theraband    Theraband Level (Shoulder External Rotation) Level 2 (Red)    Flexion AAROM;10 reps    Flexion Limitations 10 sec hold    Other Seated Exercises scap squeeze x 10; posterior shoulder rolls x 10      Shoulder Exercises: Stretch   Other Shoulder Stretches trunk rotation 2x30 sec holds   with deep breaths   Other Shoulder Stretches Median nerve glides x20      Modalities   Modalities Vasopneumatic      Cryotherapy   Number Minutes Cryotherapy 10 Minutes    Cryotherapy Location Shoulder    Type of Cryotherapy Ice massage   along upper trap and rhomboids     Vasopneumatic   Number Minutes Vasopneumatic  10 minutes    Vasopnuematic Location  Shoulder    Vasopneumatic Pressure --   no pressure   Vasopneumatic Temperature  34      Manual Therapy   Manual therapy comments self massage with myofacial ball  PT Long Term Goals - 05/28/21 0913       PT LONG TERM GOAL #1   Title Pt will be independent in HEP    Time 4    Period Weeks    Status New    Target Date 06/25/21      PT LONG TERM GOAL #2   Title Pt will improve FOTO to >= 68 to demo improved functional mobility    Time 4    Period Weeks    Status New    Target Date 06/25/21      PT LONG TERM GOAL #3   Title Pt will improve LT shoulder strength to 4/5 to improve ability to complete ADLs and IADLs    Time 4    Period Weeks    Status New    Target Date 06/25/21      PT LONG TERM GOAL #4   Title Pt will improve LT grip strength to >= 30lbs to progress to return to work    Time 4    Period Weeks    Status New    Target Date 06/25/21                   Plan - 05/31/21 1216     Clinical Impression Statement Went through portions of new additions to her HEP. Primarily worked on nerve gliding. Pt was unable to tolerate both neck and arm  movements at the same time, so performed them in separate parts. Continued manual work for desensitization around her scapula and upper trap. Initiated AAROM shoulder flexion and abduction on table to start increasing scapular mobility. Required ice at end of session.    Personal Factors and Comorbidities Time since onset of injury/illness/exacerbation;Past/Current Experience    Examination-Activity Limitations Hygiene/Grooming;Bathing;Carry;Reach Overhead    Examination-Participation Restrictions Cleaning;Community Activity;Driving;Occupation;Meal Prep;Yard Work    Rehab Potential Good    PT Frequency 3x / week    PT Duration 4 weeks    PT Treatment/Interventions Cryotherapy;Aquatic Therapy;Electrical Stimulation;Traction;Neuromuscular re-education;Therapeutic exercise;Therapeutic activities;Patient/family education;Manual techniques;Passive range of motion;Dry needling;Taping;Vasopneumatic Device    PT Next Visit Plan assess and progress HEP (pt only has visits until end of the month), shoulder strength and ROM    PT Home Exercise Plan [email or web address]    Consulted and Agree with Plan of Care Patient             Patient will benefit from skilled therapeutic intervention in order to improve the following deficits and impairments:  Pain, Decreased strength, Decreased activity tolerance, Decreased range of motion, Decreased mobility, Impaired UE functional use, Impaired sensation  Visit Diagnosis: Muscle weakness (generalized)  Radiculopathy, cervical region  Cervicalgia  Abnormal posture     Problem List Patient Active Problem List   Diagnosis Date Noted   Prediabetes 03/20/2021   Drug-induced constipation 03/20/2021   Other adverse food reactions, not elsewhere classified, subsequent encounter 02/05/2021   Pruritus 02/05/2021   Multiple drug allergies 02/05/2021   Angio-edema 02/05/2021   Tinea capitis 12/18/2020   Cardiomegaly 11/29/2020   Carotid bruit 11/29/2020   Systolic  murmur 60/63/0160   Idiopathic anaphylaxis 11/29/2020   Chronic right-sided headache 11/29/2020   Cervical radiculopathy 10/04/2020   Ankle swelling, right 07/05/2020   Toenail fungus 07/05/2020   Localized swelling, mass and lump, neck 06/15/2018   Intervertebral disc disorder 06/04/2018   Shortness of breath 06/04/2018   Urinary frequency 04/22/2018   Anxiety disorder 03/18/2017   Diverticula of intestine 01/16/2017   Obstructive sleep apnea syndrome  11/11/2016   Hyperlipidemia 04/09/2016   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   Asthma 04/24/2015   Seasonal allergies 04/24/2015   Seborrheic dermatitis 01/16/2014   Electrocardiogram abnormal 06/01/2013   Hypertensive heart disease without congestive heart failure 06/01/2013   Palpitations 06/01/2013   Polycystic ovaries 04/30/2013   Essential hypertension 12/11/2011   Other allergic rhinitis 05/13/2011    [facility] April Dell Ponto, Albia, DPT 05/31/2021, 12:38 PM  [facility] [address] [place], [place], [address] Phone: [phone]   Fax:  [phone]  Name: Jessica Powell MRN: [id number] Date of Birth: 14-Jun-1980

## 2021-06-03 ENCOUNTER — Encounter: Payer: Self-pay | Admitting: Rehabilitative and Restorative Service Providers"

## 2021-06-03 ENCOUNTER — Other Ambulatory Visit: Payer: Self-pay

## 2021-06-03 ENCOUNTER — Encounter: Payer: Self-pay | Admitting: Family Medicine

## 2021-06-03 ENCOUNTER — Ambulatory Visit: Payer: No Typology Code available for payment source | Admitting: Rehabilitative and Restorative Service Providers"

## 2021-06-03 DIAGNOSIS — M6281 Muscle weakness (generalized): Secondary | ICD-10-CM | POA: Diagnosis not present

## 2021-06-03 DIAGNOSIS — M542 Cervicalgia: Secondary | ICD-10-CM

## 2021-06-03 DIAGNOSIS — R293 Abnormal posture: Secondary | ICD-10-CM

## 2021-06-03 DIAGNOSIS — M5412 Radiculopathy, cervical region: Secondary | ICD-10-CM

## 2021-06-03 NOTE — Patient Instructions (Addendum)
°  Access Code: [email or web address] URL: [email or web address] Date: 06/03/2021 Prepared by: Corlis Leak  Exercises Circular Shoulder Pendulum with Table Support - 1 x daily - 7 x weekly - 3 sets - 10 reps Horizontal Shoulder Pendulum with Table Support - 1 x daily - 7 x weekly - 3 sets - 10 reps Flexion-Extension Shoulder Pendulum with Table Support - 1 x daily - 7 x weekly - 3 sets - 10 reps Seated Scapular Retraction - 1 x daily - 7 x weekly - 2 sets - 10 reps Supine Shoulder Flexion Extension AAROM with Dowel - 1 x daily - 7 x weekly - 2 sets - 10 reps Standing Shoulder Abduction AAROM with Dowel - 1 x daily - 7 x weekly - 2 sets - 10 reps Seated Cervical Retraction - 2 x daily - 7 x weekly - 1-2 sets - 5-10 reps - 10 sec hold Standing Scapular Retraction - 3 x daily - 7 x weekly - 1 sets - 10 reps - 10 sec hold Scapular Retraction with Resistance - 2 x daily - 7 x weekly - 1 sets - 3 reps - 30 sec hold Shoulder External Rotation and Scapular Retraction with Resistance - 2 x daily - 7 x weekly - 1 sets - 10 reps - 3-5 sec hold Seated Trunk Rotation - Hands on Shoulders - 2 x daily - 7 x weekly - 1 sets - 3 reps - 3-5 sec hold Seated Cervical Sidebending AROM - 2 x daily - 7 x weekly - 1 sets - 5 reps - 5-10 sec hold Standing Pectoral Release with Ball at [employer] - 3-4 x daily - 7 x weekly Standing Infraspinatus/Teres Minor Release with Ball at [employer] - 2 x daily - 7 x weekly Median Nerve Flossing - 1 x daily - 7 x weekly - 1 sets - 10 reps Gentle Levator Scapulae Stretch - 1 x daily - 7 x weekly - 3 sets - 60 sec hold Seated Shoulder Flexion Towel Slide at Table Top - 1 x daily - 7 x weekly - 1 sets - 10 reps Seated Shoulder Scaption Slide at Table Top with Forearm in Neutral - 1 x daily - 7 x weekly - 1 sets - 10 reps Supine Double Knee to Chest - 2 x daily - 7 x weekly - 1 sets - 3 reps - 30 sec hold Supine Chest Stretch on Foam Roll - 2 x daily - 7 x weekly - 1 sets - 1 reps - 2-5 min sec  hold Supine Lower Trunk Rotation - 2 x daily - 7 x weekly - 1 sets - 3-5 reps - 20-30 sec hold Doorway Pec Stretch at 60 Degrees Abduction - 3 x daily - 7 x weekly - 1 sets - 3 reps Doorway Pec Stretch at 90 Degrees Abduction - 3 x daily - 7 x weekly - 1 sets - 3 reps - 30 seconds hold Doorway Pec Stretch at 120 Degrees Abduction - 3 x daily - 7 x weekly - 1 sets - 3 reps - 30 second hold hold Standing Bilateral Low Shoulder Row with Anchored Resistance - 2 x daily - 7 x weekly - 1-3 sets - 10 reps - 2-3 sec hold Drawing Bow - 1 x daily - 7 x weekly - 1 sets - 10 reps - 3 sec hold

## 2021-06-03 NOTE — Therapy (Signed)
[facility] [place] [address] [place], [place], [address] Phone: [phone]   Fax:  [phone]  Physical Therapy Treatment  Patient Details  Name: Jessica Powell MRN: [id number] Date of Birth: June 02, 1980 Referring Provider (PT): MCDANIEL, [place]   Encounter Date: 06/03/2021   PT End of Session - 06/03/21 6222     Visit Number 4    Number of Visits 12    Date for PT Re-Evaluation 06/25/21    PT Start Time 0720    PT Stop Time 0810    PT Time Calculation (min) 50 min    Activity Tolerance Patient tolerated treatment well             Past Medical History:  Diagnosis Date   Allergy    Anginal pain (HCC)    Asthma    Complication of anesthesia    asthma attack after waking up from EGD in July 2022   Heart murmur    Hx of migraines    Neck sprain 06/04/2018   OSA on CPAP    PCOS (polycystic ovarian syndrome)    Pre-diabetes    Urticaria     Past Surgical History:  Procedure Laterality Date   POSTERIOR CERVICAL LAMINECTOMY Left 02/21/2021   Procedure: FORAMINOTOMY, Left  Cervical six-seven;  Surgeon: Bethann Goo, DO;  [facility]: [facility];  Service: Neurosurgery;  Laterality: Left;   TONSILLECTOMY     UVULOPALATOPHARYNGOPLASTY      There were no vitals filed for this visit.   Subjective Assessment - 06/03/21 0722     Subjective Patient reports that "everything is better". She has less pain; is using less medication; moving her arms better; feeling better; sleeping in the bed. She is working on her exercises at home and moving more.    Currently in Pain? Yes    Pain Score 3     Pain Location Shoulder    Pain Orientation Left    Pain Descriptors / Indicators Aching    Pain Type Surgical pain;Chronic pain    Pain Radiating Towards clavicle    Pain Onset More than a month ago    Pain Frequency Intermittent                               [facility] Adult PT Treatment/Exercise - 06/03/21 0001        Shoulder Exercises: Standing   Row Strengthening;Both;10 reps;Theraband    Theraband Level (Shoulder Row) Level 3 (Green)    Row Limitations bow and arrow green TB x 10 reps      Shoulder Exercises: Pulleys   Flexion Limitations 10 sec hold x 10 reps    Scaption Limitations 10 sec hold x 10 reps      Shoulder Exercises: ROM/Strengthening   UBE (Upper Arm Bike) L 2 x 4 min alternating fwd/back; repeated end of treatment      Shoulder Exercises: Stretch   Corner Stretch Limitations doorway stretch 3 postions hoding 15-20 sec 2 reps each position - stretch to pt tolerance will work to progress    Table Stretch - Flexion 3 reps;10 seconds    Table Stretch -Flexion Limitations hands on counter stepping back    Table Stretch - ABduction Limitations T supine - 60 sec hold x 3 reps    Other Shoulder Stretches knee to chest 10 sec hold x 5 each LE; double knee to chest x 3 reps 10 sec hold  Cryotherapy   Number Minutes Cryotherapy 10 Minutes    Cryotherapy Location Shoulder    Type of Cryotherapy Ice pack                     PT Education - 06/03/21 0801     Education Details HEP    Person(s) Educated Patient    Methods Explanation;Demonstration;Tactile cues;Verbal cues;Handout    Comprehension Verbalized understanding;Returned demonstration;Verbal cues required;Tactile cues required                 PT Long Term Goals - 05/28/21 0913       PT LONG TERM GOAL #1   Title Pt will be independent in HEP    Time 4    Period Weeks    Status New    Target Date 06/25/21      PT LONG TERM GOAL #2   Title Pt will improve FOTO to >= 68 to demo improved functional mobility    Time 4    Period Weeks    Status New    Target Date 06/25/21      PT LONG TERM GOAL #3   Title Pt will improve LT shoulder strength to 4/5 to improve ability to complete ADLs and IADLs    Time 4    Period Weeks    Status New    Target Date 06/25/21      PT LONG TERM GOAL #4    Title Pt will improve LT grip strength to >= 30lbs to progress to return to work    Time 4    Period Weeks    Status New    Target Date 06/25/21                   Plan - 06/03/21 0725     Clinical Impression Statement Great progress with increased mobility and decreased pain. Patient is tolerating increased activity and exercise in clinic. Progressing well. Last cvered visit tomorrow. Assess progress and provide HEP as indicated.    Rehab Potential Good    PT Frequency 3x / week    PT Duration 4 weeks    PT Treatment/Interventions Cryotherapy;Aquatic Therapy;Electrical Stimulation;Traction;Neuromuscular re-education;Therapeutic exercise;Therapeutic activities;Patient/family education;Manual techniques;Passive range of motion;Dry needling;Taping;Vasopneumatic Device    PT Next Visit Plan progress HEP (pt only has visits until end of the month), shoulder strength and ROM    PT Home Exercise Plan [email or web address]    Consulted and Agree with Plan of Care Patient             Patient will benefit from skilled therapeutic intervention in order to improve the following deficits and impairments:     Visit Diagnosis: Muscle weakness (generalized)  Radiculopathy, cervical region  Cervicalgia  Abnormal posture     Problem List Patient Active Problem List   Diagnosis Date Noted   Prediabetes 03/20/2021   Drug-induced constipation 03/20/2021   Other adverse food reactions, not elsewhere classified, subsequent encounter 02/05/2021   Pruritus 02/05/2021   Multiple drug allergies 02/05/2021   Angio-edema 02/05/2021   Tinea capitis 12/18/2020   Cardiomegaly 11/29/2020   Carotid bruit 11/29/2020   Systolic murmur 11/29/2020   Idiopathic anaphylaxis 11/29/2020   Chronic right-sided headache 11/29/2020   Cervical radiculopathy 10/04/2020   Ankle swelling, right 07/05/2020   Toenail fungus 07/05/2020   Localized swelling, mass and lump, neck 06/15/2018   Intervertebral disc  disorder 06/04/2018   Shortness of breath 06/04/2018   Urinary frequency 04/22/2018   Anxiety disorder  03/18/2017   Diverticula of intestine 01/16/2017   Obstructive sleep apnea syndrome 11/11/2016   Hyperlipidemia 04/09/2016   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   Asthma 04/24/2015   Seasonal allergies 04/24/2015   Seborrheic dermatitis 01/16/2014   Electrocardiogram abnormal 06/01/2013   Hypertensive heart disease without congestive heart failure 06/01/2013   Palpitations 06/01/2013   Polycystic ovaries 04/30/2013   Essential hypertension 12/11/2011   Other allergic rhinitis 05/13/2011    Zyaira Vejar Rober Minion, PT, MPH 06/03/2021, 8:05 AM  [facility] [address] [place], [place], [address] Phone: [phone]   Fax:  [phone]  Name: Josephene Plautz MRN: [id number] Date of Birth: 1981/03/18

## 2021-06-04 ENCOUNTER — Ambulatory Visit: Payer: No Typology Code available for payment source | Admitting: Allergy

## 2021-06-04 ENCOUNTER — Other Ambulatory Visit: Payer: Self-pay

## 2021-06-04 ENCOUNTER — Ambulatory Visit: Payer: No Typology Code available for payment source | Admitting: Physical Therapy

## 2021-06-04 DIAGNOSIS — M6281 Muscle weakness (generalized): Secondary | ICD-10-CM

## 2021-06-04 DIAGNOSIS — M542 Cervicalgia: Secondary | ICD-10-CM

## 2021-06-04 DIAGNOSIS — R293 Abnormal posture: Secondary | ICD-10-CM

## 2021-06-04 DIAGNOSIS — M5412 Radiculopathy, cervical region: Secondary | ICD-10-CM

## 2021-06-04 NOTE — Patient Instructions (Signed)
Access Code: [email or web address] URL: [email or web address] Date: 06/04/2021 Prepared by: Reggy [facility]  Exercises Circular Shoulder Pendulum with Table Support - 1 x daily - 7 x weekly - 3 sets - 10 reps Horizontal Shoulder Pendulum with Table Support - 1 x daily - 7 x weekly - 3 sets - 10 reps Flexion-Extension Shoulder Pendulum with Table Support - 1 x daily - 7 x weekly - 3 sets - 10 reps Standing Scapular Retraction - 3 x daily - 7 x weekly - 1 sets - 10 reps - 10 sec hold Shoulder External Rotation and Scapular Retraction with Resistance - 2 x daily - 7 x weekly - 1 sets - 10 reps - 3-5 sec hold Seated Trunk Rotation - Hands on Shoulders - 2 x daily - 7 x weekly - 1 sets - 3 reps - 3-5 sec hold Seated Cervical Sidebending AROM - 2 x daily - 7 x weekly - 1 sets - 5 reps - 5-10 sec hold Standing Pectoral Release with Ball at Wall - 3-4 x daily - 7 x weekly Standing Infraspinatus/Teres Minor Release with Ball at [employer] - 2 x daily - 7 x weekly Median Nerve Flossing - 1 x daily - 7 x weekly - 1 sets - 10 reps Gentle Levator Scapulae Stretch - 1 x daily - 7 x weekly - 3 sets - 60 sec hold Seated Shoulder Flexion Towel Slide at Table Top - 1 x daily - 7 x weekly - 1 sets - 10 reps Seated Shoulder Scaption Slide at Table Top with Forearm in Neutral - 1 x daily - 7 x weekly - 1 sets - 10 reps Supine Chest Stretch on Foam Roll - 2 x daily - 7 x weekly - 1 sets - 1 reps - 2-5 min sec hold Doorway Pec Stretch at 60 Degrees Abduction - 3 x daily - 7 x weekly - 1 sets - 3 reps Doorway Pec Stretch at 90 Degrees Abduction - 3 x daily - 7 x weekly - 1 sets - 3 reps - 30 seconds hold Doorway Pec Stretch at 120 Degrees Abduction - 3 x daily - 7 x weekly - 1 sets - 3 reps - 30 second hold hold Standing Bilateral Low Shoulder Row with Anchored Resistance - 2 x daily - 7 x weekly - 1-3 sets - 10 reps - 2-3 sec hold Drawing Bow - 1 x daily - 7 x weekly - 1 sets - 10 reps - 3 sec hold Shoulder PNF D2  Flexion - 1 x daily - 7 x weekly - 1 sets - 10 reps Standing Single Arm Shoulder PNF D1 Flexion - 1 x daily - 7 x weekly - 1 sets - 10 reps

## 2021-06-04 NOTE — Therapy (Addendum)
[place], [place], [address] Phone: [phone]   Fax:  [phone]  Physical Therapy Treatment and Discharge  Patient Details  Name: Jessica Powell MRN: [id number] Date of Birth: May 12, 1980 Referring Provider (PT): MCDANIEL, [place]   Encounter Date: 06/04/2021   PT End of Session - 06/04/21 1102     Visit Number 5    Number of Visits 12    Date for PT Re-Evaluation 06/25/21    PT Start Time 0930    PT Stop Time 1025    PT Time Calculation (min) 55 min    Activity Tolerance Patient tolerated treatment well    Behavior During Therapy [facility] for tasks assessed/performed             Past Medical History:  Diagnosis Date   Allergy    Anginal pain ([place])    Asthma    Complication of anesthesia    asthma attack after waking up from EGD in July 2022   Heart murmur    Hx of migraines    Neck sprain 06/04/2018   OSA on CPAP    PCOS (polycystic ovarian syndrome)    Pre-diabetes    Urticaria     Past Surgical History:  Procedure Laterality Date   POSTERIOR CERVICAL LAMINECTOMY Left 02/21/2021   Procedure: FORAMINOTOMY, Left  Cervical six-seven;  Surgeon: Karsten Ro, DO;  Location: [facility];  Service: Neurosurgery;  Laterality: Left;   TONSILLECTOMY     UVULOPALATOPHARYNGOPLASTY      There were no vitals filed for this visit.   Subjective Assessment - 06/04/21 0935     Subjective Pt states "i really am not hurting anymore". She states her finger and shoulder mobility is much improved    Patient Stated Goals decrease pain and improve Lt UE strength    Currently in Pain? No/denies                [facility] PT Assessment - 06/04/21 0001       Assessment   Medical Diagnosis cervical radiculopathy    Referring Provider (PT) MCDANIEL, JOSHUA    Onset Date/Surgical Date 02/21/21    Hand Dominance Right    Prior Therapy 12/2020 prior to cervical surgery      Precautions   Precautions  None      Restrictions   Weight Bearing Restrictions No      Balance Screen   Has the patient fallen in the past 6 months No      Observation/Other Assessments   Focus on Therapeutic Outcomes (FOTO)  53      Sensation   Additional Comments impaired sensation Lt UE      AROM   Cervical Flexion 43    Cervical Extension 25    Cervical - Right Side Bend 35    Cervical - Left Side Bend 40    Cervical - Right Rotation 57    Cervical - Left Rotation 60      Strength   Overall Strength Comments Lt shoulder grossly 3/5    Left Hand Grip (lbs) 33                           [facility] Adult PT Treatment/Exercise - 06/04/21 0001       Shoulder Exercises: Seated   External Rotation Limitations bilat ER with red TB 2 x 10    Other Seated Exercises scap squeeze x 10, shoulder rolls  x 10      Shoulder Exercises: Standing   Row 20 reps    Theraband Level (Shoulder Row) Level 3 (Green)    Row Limitations bow and arrow green TB x 20 reps    Diagonals Limitations D1 and D2 flexion x 10 no resistance      Shoulder Exercises: ROM/Strengthening   UBE (Upper Arm Bike) L 2 x 4 min alternating fwd/back; repeated end of treatment      Shoulder Exercises: Stretch   Table Stretch - Flexion 5 reps;10 seconds    Table Stretch -Flexion Limitations hands on counter stepping back    Table Stretch - ABduction Limitations T supine - 60 sec hold x 3 reps    Other Shoulder Stretches median nerve glide x 10      Cryotherapy   Number Minutes Cryotherapy 10 Minutes    Cryotherapy Location Shoulder    Type of Cryotherapy Ice pack      Manual Therapy   Manual therapy comments self massage ball on wall                     PT Education - 06/04/21 1007     Education Details updated HEP    Person(s) Educated Patient    Methods Explanation;Demonstration;Handout    Comprehension Returned demonstration;Verbalized understanding                 PT Long Term Goals -  06/04/21 0940       PT LONG TERM GOAL #1   Title Pt will be independent in HEP    Status Achieved      PT LONG TERM GOAL #3   Title Pt will improve LT shoulder strength to 4/5 to improve ability to complete ADLs and IADLs    Baseline grossly 3/5    Status Not Met      PT LONG TERM GOAL #4   Title Pt will improve LT grip strength to >= 30lbs to progress to return to work    Baseline 33 lbs    Status Achieved                   Plan - 06/04/21 1103     Clinical Impression Statement Pt has mad great progress as she demos decreased pain and improving strength and ROM. HEP updated. Pt reports she feels she can return to work as she has improved shoulder and grip strength. PT emphasized importance of continuing stretching and strengthening and ice once she returns to work. HEP updated    PT Next Visit Plan shoulder strength and ROM    PT Home Exercise Plan [email or web address]    Consulted and Agree with Plan of Care Patient             Patient will benefit from skilled therapeutic intervention in order to improve the following deficits and impairments:     Visit Diagnosis: Muscle weakness (generalized)  Radiculopathy, cervical region  Cervicalgia  Abnormal posture     Problem List Patient Active Problem List   Diagnosis Date Noted   Prediabetes 03/20/2021   Drug-induced constipation 03/20/2021   Other adverse food reactions, not elsewhere classified, subsequent encounter 02/05/2021   Pruritus 02/05/2021   Multiple drug allergies 02/05/2021   Angio-edema 02/05/2021   Tinea capitis 12/18/2020   Cardiomegaly 11/29/2020   Carotid bruit 50/53/9767   Systolic murmur 34/19/3790   Idiopathic anaphylaxis 11/29/2020   Chronic right-sided headache 11/29/2020   Cervical radiculopathy 10/04/2020  Ankle swelling, right 07/05/2020   Toenail fungus 07/05/2020   Localized swelling, mass and lump, neck 06/15/2018   Intervertebral disc disorder 06/04/2018   Shortness of breath  06/04/2018   Urinary frequency 04/22/2018   Anxiety disorder 03/18/2017   Diverticula of intestine 01/16/2017   Obstructive sleep apnea syndrome 11/11/2016   Hyperlipidemia 04/09/2016   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   Asthma 04/24/2015   Seasonal allergies 04/24/2015   Seborrheic dermatitis 01/16/2014   Electrocardiogram abnormal 06/01/2013   Hypertensive heart disease without congestive heart failure 06/01/2013   Palpitations 06/01/2013   Polycystic ovaries 04/30/2013   Essential hypertension 12/11/2011   Other allergic rhinitis 05/13/2011  PHYSICAL THERAPY DISCHARGE SUMMARY  Visits from Start of Care: 5  Current functional level related to goals / functional outcomes: Decreased pain, improved strength and mobility   Remaining deficits: strength   Education / Equipment: HEP   Patient agrees to discharge. Patient goals were partially met. Patient is being discharged due to being pleased with the current functional level. Isabelle Course, PT,DPT03/11/2309:15 AM    Isabelle Course, PT 06/04/2021, 11:09 AM  [facility] [place], [place], [address] Phone: [phone]   Fax:  [phone]  Name: Analisa Sledd MRN: [id number] Date of Birth: 1980-11-12

## 2021-06-04 NOTE — Progress Notes (Unsigned)
Follow Up Note  RE: Jessica Powell MRN: [id number] DOB: 02/17/1981 Date of Office Visit: 06/04/2021  Referring provider: Jerrol Banana, MD Primary care provider: Jerrol Banana, MD  Chief Complaint: No chief complaint on file.  History of Present Illness: I had the pleasure of seeing Jessica Powell for a follow up visit at the [facility] on 06/04/2021. She is a 41 y.o. female, who is being followed for adverse food reaction, angioedema, allergic rhinitis, asthma, pruritus, multiple drug allergies. Her previous allergy office visit was on 02/04/2021 with Dr. Selena Batten. Today is a regular follow up visit.  I reviewed the bloodwork. Blood count, kidney function, electrolytes, thyroid, autoimmune screener, inflammation markers, chronic urticaria index (checks for autoantibodies that trigger mast cells), tryptase (checks for mast cell issues), angioedema panel (checks for swelling issues) and alpha gal (checks for red meat allergy) were all normal which is great.    Nut panel was positive to hazelnut only.  Shellfish panel, latex, soy, avocado, pineapple, mushroom, anchovy was all negative.   One of the liver enzymes called ALT was slightly elevated - will recheck this.    Continue to avoid pineapple, shellfish, tree nuts, avocado, mushroom, Caesar dressing, soy.  Keep appointment next week to discuss further.    Other adverse food reactions, not elsewhere classified, subsequent encounter Patient endorses multiple food allergies.  Most recently had some facial swelling after eating frozen pineapples.  Symptoms resolved within 30 minutes after taking Benadryl.  Shrimp cause throat tightness and facial swelling in the past.  Tree nuts, mushrooms, soy and Caesar salad dressing cause perioral itching and tongue swelling.  Unknown reaction to avocado.  No prior food allergy work-up. Today's skin testing showed: Negative to select foods. Continue avoidance of pineapple,  shellfish, tree nuts, avocado, mushroom, Caesar dressing, soy.  Get bloodwork For mild symptoms you can take over the counter antihistamines such as Benadryl and monitor symptoms closely. If symptoms worsen or if you have severe symptoms including breathing issues, throat closure, significant swelling, whole body hives, severe diarrhea and vomiting, lightheadedness then inject epinephrine and seek immediate medical care afterwards. Emergency action plan given.   Angio-edema Patient is having episodes of facial swelling which resolve fairly quickly.  Not on ACE inhibitor's.  She is concerned about food allergies. Keep track of reactions/swelling episodes. Take pictures. Write down what you had eaten/come across that day. Get bloodwork as below.   Other allergic rhinitis Takes Zyrtec and Singulair daily good benefit.  Complains of itching since off antihistamines.  2020 blood work was positive to dog, grass, tree. Continue environmental control measures. Use over the counter antihistamines such as Zyrtec (cetirizine), Claritin (loratadine), Allegra (fexofenadine), or Xyzal (levocetirizine) daily as needed. May take twice a day during allergy flares. May switch antihistamines every few months. Continue Singulair (montelukast) 10mg  daily at night. Use Flonase (fluticasone) nasal spray 1 spray per nostril twice a day as needed for nasal congestion.    Asthma Used to follow with a pulmonologist in .  Stable with below regimen. Today's spirometry showed some restriction. Daily controller medication(s): Symbicort [place] 2 puffs twice a day with spacer and rinse mouth afterwards. Continue Singulair (montelukast) 10mg  daily at night. May use albuterol rescue inhaler 2 puffs every 4 to 6 hours as needed for shortness of breath, chest tightness, coughing, and wheezing. May use albuterol rescue inhaler 2 puffs 5 to 15 minutes prior to strenuous physical activities. Monitor frequency of use.     Pruritus  Itching since off antihistamines. See below for proper skin care. Take zyrtec 10mg  once a day - twice a day during flares.  Get bloodwork to rule out other etiologies.    Multiple drug allergies Continue to avoid medications on allergy list.  Check latex IgE.   Return in about 3 months (around 05/07/2021).    Assessment and Plan: Jessica Powell is a 41 y.o. female with: No problem-specific Assessment & Plan notes found for this encounter.  No follow-ups on file.  No orders of the defined types were placed in this encounter.  Lab Orders  No laboratory test(s) ordered today    Diagnostics: Spirometry:  Tracings reviewed. Her effort: {Blank single:19197::"Good reproducible efforts.","It was hard to get consistent efforts and there is a question as to whether this reflects a maximal maneuver.","Poor effort, data can not be interpreted."} FVC: ***L FEV1: ***L, ***% predicted FEV1/FVC ratio: ***% Interpretation: {Blank single:19197::"Spirometry consistent with mild obstructive disease","Spirometry consistent with moderate obstructive disease","Spirometry consistent with severe obstructive disease","Spirometry consistent with possible restrictive disease","Spirometry consistent with mixed obstructive and restrictive disease","Spirometry uninterpretable due to technique","Spirometry consistent with normal pattern","No overt abnormalities noted given today's efforts"}.  Please see scanned spirometry results for details.  Skin Testing: {Blank single:19197::"Select foods","Environmental allergy panel","Environmental allergy panel and select foods","Food allergy panel","None","Deferred due to recent antihistamines use"}. *** Results discussed with patient/family.   Medication List:  Current Outpatient Medications  Medication Sig Dispense Refill   albuterol (VENTOLIN HFA) 108 (90 Base) MCG/ACT inhaler Inhale 2 puffs into the lungs every 4 (four) hours as needed for wheezing or  shortness of breath (coughing fits). 18 g 1   budesonide-formoterol (SYMBICORT) 160-4.5 MCG/ACT inhaler Inhale 2 puffs into the lungs in the morning and at bedtime. with spacer and rinse mouth afterwards. 10.2 g 2   cetirizine (ZYRTEC) 10 MG tablet Take 10 mg by mouth daily.     EPINEPHrine 0.3 mg/0.3 mL IJ SOAJ injection Inject 0.3 mg into the muscle as needed for anaphylaxis.     gabapentin (NEURONTIN) 300 MG capsule Take 1 capsule by mouth 3 times a day with 2 capsules during the evening dose 120 capsule 1   HYDROcodone-acetaminophen (NORCO/VICODIN) 5-325 MG tablet Take 0.5 tablets by mouth every 6 (six) hours as needed for moderate pain.     ketoconazole (NIZORAL) 2 % shampoo Apply to scalp twice a week for 8 weeks and then weekly thereafter 120 mL 11   medroxyPROGESTERone (PROVERA) 10 MG tablet Take 1 tablet (10 mg total) by mouth daily. For 7 days 21 tablet 3   methocarbamol (ROBAXIN) 750 MG tablet Take 1 tablet by mouth 3 times every day 90 tablet 2   metroNIDAZOLE (FLAGYL) 500 MG tablet Take 1 tablet (500 mg total) by mouth 2 (two) times daily. 14 tablet 0   montelukast (SINGULAIR) 10 MG tablet Take 1 tablet (10 mg total) by mouth at bedtime. 30 tablet 2   polyethylene glycol (MIRALAX / GLYCOLAX) 17 g packet Take 17 g by mouth daily as needed.     sodium chloride (OCEAN) 0.65 % SOLN nasal spray Place 1 spray into both nostrils as needed for congestion.     telmisartan-hydrochlorothiazide (MICARDIS HCT) 80-25 MG tablet Take 1 tablet by mouth daily. 30 tablet 2   No current facility-administered medications for this visit.   Allergies: Allergies  Allergen Reactions   Clindamycin/Lincomycin Hives and Swelling   Gramineae Pollens Shortness Of Breath   Influenza Vaccines Anaphylaxis   Mushroom Extract Complex Swelling   Oxycodone Other (See  Comments)    Hallucinations   Shellfish Allergy Swelling   Shrimp (Diagnostic) Swelling   Avocado Swelling   Labetalol     Other reaction(s):  neurological reaction headache   Latex Itching   Lisinopril Cough    Other reaction(s): neurological reaction headache   Magnesium Other (See Comments)    Neurological reaction   Metformin Hcl Other (See Comments)    Hypoglycemia   Other Itching and Swelling    Ceaser Dressing- Anchovy (tongue swelling & itchy throat)   Pineapple Swelling   Soy Allergy Swelling    Tongue Swelling   Sumatriptan Other (See Comments)    Neurological reaction   I reviewed her past medical history, social history, family history, and environmental history and no significant changes have been reported from her previous visit.  Review of Systems  Constitutional:  Negative for appetite change, chills, fever and unexpected weight change.  HENT:  Negative for congestion and rhinorrhea.   Eyes:  Negative for itching.  Respiratory:  Negative for cough, chest tightness, shortness of breath and wheezing.   Cardiovascular:  Negative for chest pain.  Gastrointestinal:  Negative for abdominal pain.  Genitourinary:  Negative for difficulty urinating.  Musculoskeletal:  Positive for neck pain and neck stiffness.  Skin:  Negative for rash.       itching  Allergic/Immunologic: Positive for environmental allergies.   Objective: There were no vitals taken for this visit. There is no height or weight on file to calculate BMI. Physical Exam Vitals and nursing note reviewed.  Constitutional:      Appearance: She is well-developed.  HENT:     Head: Normocephalic and atraumatic.     Right Ear: Tympanic membrane and external ear normal.     Left Ear: Tympanic membrane and external ear normal.     Nose: Nose normal.     Mouth/Throat:     Mouth: Mucous membranes are moist.     Pharynx: Oropharynx is clear.  Eyes:     Conjunctiva/sclera: Conjunctivae normal.  Cardiovascular:     Rate and Rhythm: Normal rate and regular rhythm.     Heart sounds: Normal heart sounds. No murmur heard.   No friction rub. No gallop.   Pulmonary:     Effort: Pulmonary effort is normal.     Breath sounds: Normal breath sounds. No wheezing, rhonchi or rales.  Musculoskeletal:     Cervical back: Neck supple.  Skin:    General: Skin is warm.     Findings: No rash.  Neurological:     Mental Status: She is alert and oriented to person, place, and time.   Previous notes and tests were reviewed. The plan was reviewed with the patient/family, and all questions/concerned were addressed.  It was my pleasure to see Jessica Powell today and participate in her care. Please feel free to contact me with any questions or concerns.  Sincerely,  Wyline Mood, DO Allergy & Immunology  [facility] office: [phone] [facility] office: [phone]

## 2021-06-07 ENCOUNTER — Telehealth: Payer: Self-pay | Admitting: Family Medicine

## 2021-06-07 NOTE — Telephone Encounter (Signed)
Copied from CRM [id number]. Topic: General - Other ?>> Jun 07, 2021  1:22 PM Randol Kern wrote: ?Reason for CRM: Pt wants to know if flu exemption form is completed please advise, she needs this today for her job  ?Best contact: [phone] ?

## 2021-06-07 NOTE — Telephone Encounter (Signed)
Patient notified via MyChart that form will be faxed today. ?

## 2021-06-13 ENCOUNTER — Encounter: Payer: Self-pay | Admitting: Family Medicine

## 2021-06-13 ENCOUNTER — Ambulatory Visit (INDEPENDENT_AMBULATORY_CARE_PROVIDER_SITE_OTHER): Payer: Self-pay | Admitting: Family Medicine

## 2021-06-13 ENCOUNTER — Other Ambulatory Visit: Payer: Self-pay

## 2021-06-13 VITALS — BP 102/72 | HR 88 | Temp 98.2°F | Ht 62.0 in | Wt 225.0 lb

## 2021-06-13 DIAGNOSIS — J310 Chronic rhinitis: Secondary | ICD-10-CM

## 2021-06-13 DIAGNOSIS — J329 Chronic sinusitis, unspecified: Secondary | ICD-10-CM | POA: Insufficient documentation

## 2021-06-13 MED ORDER — AZITHROMYCIN 250 MG PO TABS
ORAL_TABLET | ORAL | 0 refills | Status: DC
Start: 2021-06-13 — End: 2021-06-14

## 2021-06-13 MED ORDER — FLUCONAZOLE 150 MG PO TABS: 150.0000 mg | ORAL_TABLET | Freq: Once | ORAL | 0 refills | Status: AC

## 2021-06-13 NOTE — Assessment & Plan Note (Signed)
1 week history of chills, congestion, forehead pressure, dizziness, and left greater than right ear pain, some nausea, no GI changes otherwise, altered taste.  Did take a home COVID test which was negative. ? ?Examination findings significant for frontal symmetric sinus tenderness, swollen erythematous turbinates, bilateral tympanic membranes and canals benign, oropharynx with cobblestone pattern, no erythema, no exudate, clear lung fields throughout with benign heart sounds. ? ?Her clinical history most consistent with sinusitis, given that it has been 7 days, I have advised patient to utilize supportive care, fluticasone and to wait to initiate antibiotics if symptoms fail to abate by this weekend.  Azithromycin was sent as well as Diflucan.  She can follow-up on as-needed basis. ?

## 2021-06-13 NOTE — Patient Instructions (Addendum)
-   Continue current oral antihistamine ?- Start Flonase, 2 sprays in each nostril daily x7 days then as needed ?- If symptoms fail to adequately improve by this weekend, start azithromycin (Z-Pak) and dose for full course ?- Can continue saline for as needed symptom relief ?- Polyps for any questions and follow-up as needed ?

## 2021-06-13 NOTE — Progress Notes (Signed)
?  ? ?  Primary Care / Sports Medicine Office Visit ? ?Patient Information:  ?Patient ID: Jessica Powell, female DOB: 16-Jan-1981 Age: 41 y.o. MRN: [id number]  ? ?[name] is a pleasant 41 y.o. female presenting with the following: ? ?Chief Complaint  ?Patient presents with  ? Ear Pain  ?  Bilateral; x1 week; lightheadedness and headache associated; 6/10 pain  ? ? ?Vitals:  ? 06/13/21 1515  ?BP: 102/72  ?Pulse: 88  ?Temp: 98.2 ?F (36.8 ?C)  ?SpO2: 98%  ? ?Vitals:  ? 06/13/21 1515  ?Weight: 225 lb (102.1 kg)  ?Height: 5\' 2"  (1.575 m)  ? ?Body mass index is 41.15 kg/m?. ? ?No results found.  ? ?Independent interpretation of notes and tests performed by another provider:  ? ?None ? ?Procedures performed:  ? ?None ? ?Pertinent History, Exam, Impression, and Recommendations:  ? ?Rhinosinusitis ?1 week history of chills, congestion, forehead pressure, dizziness, and left greater than right ear pain, some nausea, no GI changes otherwise, altered taste.  Did take a home COVID test which was negative. ? ?Examination findings significant for frontal symmetric sinus tenderness, swollen erythematous turbinates, bilateral tympanic membranes and canals benign, oropharynx with cobblestone pattern, no erythema, no exudate, clear lung fields throughout with benign heart sounds. ? ?Her clinical history most consistent with sinusitis, given that it has been 7 days, I have advised patient to utilize supportive care, fluticasone and to wait to initiate antibiotics if symptoms fail to abate by this weekend.  Azithromycin was sent as well as Diflucan.  She can follow-up on as-needed basis.  ? ?Orders & Medications ?Meds ordered this encounter  ?Medications  ? azithromycin (ZITHROMAX) 250 MG tablet  ?  Sig: Take 2 tablets on day 1, then 1 tablet daily on days 2 through 5  ?  Dispense:  6 tablet  ?  Refill:  0  ? fluconazole (DIFLUCAN) 150 MG tablet  ?  Sig: Take 1 tablet (150 mg total) by mouth once for 1 dose. Can repeat x 1 if  needed  ?  Dispense:  2 tablet  ?  Refill:  0  ? ?No orders of the defined types were placed in this encounter. ?  ? ?Return if symptoms worsen or fail to improve.  ?  ? ? , MD ? ? Primary Care Sports Medicine ?[facility] ?[facility]  ? ?

## 2021-06-14 ENCOUNTER — Other Ambulatory Visit: Payer: Self-pay | Admitting: Family Medicine

## 2021-06-14 ENCOUNTER — Telehealth: Payer: Self-pay | Admitting: Family Medicine

## 2021-06-14 DIAGNOSIS — J310 Chronic rhinitis: Secondary | ICD-10-CM

## 2021-06-14 MED ORDER — AZITHROMYCIN 250 MG PO TABS: ORAL_TABLET | ORAL | 0 refills | Status: AC

## 2021-06-14 MED ORDER — FLUCONAZOLE 150 MG PO TABS: 150.0000 mg | ORAL_TABLET | Freq: Once | ORAL | 0 refills | Status: AC

## 2021-06-14 NOTE — Telephone Encounter (Signed)
Medication: azithromycin (ZITHROMAX) 250 MG tablet [id number], Diflucan was not sent but noted on the AVS ? ?Has the patient contacted their pharmacy? YES Pt states that the pharmacy did not receive these medications ?(Agent: If no, request that the patient contact the pharmacy for the refill. If patient does not wish to contact the pharmacy document the reason why and proceed with request.) ?(Agent: If yes, when and what did the pharmacy advise?) ? ?Preferred Pharmacy (with phone number or street name): [facility] [place]  ?Has the patient been seen for an appointment in the last year OR does the patient have an upcoming appointment? Yes.   ? ?Agent: Please be advised that RX refills may take up to 3 business days. We ask that you follow-up with your pharmacy. ?

## 2021-06-14 NOTE — Telephone Encounter (Signed)
Pharmacy did not receive meds sent on 06/13/21, called office and spoke to Marylene Land, [facility] who placed me on hold when I asked if Dr. Ashley Royalty is still in the office. She says she sent a message to him, advised I'm routing this note. ?

## 2021-08-07 ENCOUNTER — Other Ambulatory Visit: Payer: Self-pay | Admitting: Family Medicine

## 2021-08-07 DIAGNOSIS — J45909 Unspecified asthma, uncomplicated: Secondary | ICD-10-CM

## 2021-08-08 NOTE — Telephone Encounter (Signed)
Requested Prescriptions  ?Pending Prescriptions Disp Refills  ?? montelukast (SINGULAIR) 10 MG tablet [Pharmacy Med Name: Montelukast Sodium 10 MG Oral Tablet] 30 tablet 3  ?  Sig: TAKE 1 TABLET BY MOUTH AT BEDTIME  ?  ? Pulmonology:  Leukotriene Inhibitors Passed - 08/07/2021  6:06 PM  ?  ?  Passed - Valid encounter within last 12 months  ?  Recent Outpatient Visits   ?      ? 1 month ago Rhinosinusitis  ? [facility] Jerrol Banana, MD  ? 2 months ago Essential hypertension  ? [facility] Jerrol Banana, MD  ? 4 months ago Prediabetes  ? [facility] Jerrol Banana, MD  ? 7 months ago Annual physical exam  ? [facility] Jerrol Banana, MD  ? 8 months ago Asthma without status asthmaticus without complication, unspecified asthma severity, unspecified whether persistent  ? [facility] Jerrol Banana, MD  ?  ?  ? ?  ?  ?  ? ?

## 2021-08-19 NOTE — Progress Notes (Deleted)
NEUROLOGY FOLLOW UP OFFICE NOTE  Reagen Wasik [id number]  Assessment/Plan:   Probable primary stabbing headache - will still rule out secondary etiologies.  Already on gabapentin for radiculopathy which isn't helping the headache.  Would like to avoid indomethacin, so will have her try melatonin Left sided cervical radiculopathy   1  ***     Subjective:  Jessica Powell is a 41 year old female with HTN, asthma, PCOS and OSA who follows up for headache.  UPDATE: MRI and MRA of brain on 02/15/2021 personally reviewed showed nonspecific punctate T2 FLAIR hyperintense focus within the anterior right frontal lobe white matter but otherwise unremarkable.  She saw neurosurgery ***  Started melatonin.  *** Intensity:  *** Duration:  *** Frequency:  *** Frequency of abortive medication: *** Current NSAIDS/analgesics:  none Current triptans:  none Current ergotamine:  none Current anti-emetic:  none Current muscle relaxants:  diazepam 2mg  PRN (muscle spasms) Current Antihypertensive medications:  none Current Antidepressant medications:  none Current Anticonvulsant medications:  gabapentin 300mg  in AM and 600mg  QHS (cervical radic) Current anti-CGRP:  none Current Vitamins/Herbal/Supplements:  none Current Antihistamines/Decongestants:  Zyrtec Other therapy:  none Hormone/birth control:  none   HISTORY:  She has history of migraines.  In April-May 2022, she started having a new type of headache.  She describes a severe stabbing pain in the right posterior parietal region lasting a few seconds.  At first, it occurred every couple of days but frequency gradually increased to several times a day.     In June, she developed severe left upper extremity pain.  She also had left occipital headaches with neck pain.  MRI of cervical spine with and without contrast on 12/20/2020 personally reviewed showed degenerative disc disease at C5-6 contacting the ventral cord without stenosis and  at C6-7 with minor canal stenosis and mild left foraminal stenosis.   Epidural injection effective for just a few days.  Taking gabapentin 300 in AM and 300mg  at 6-7PM and 300mg  at bedtime.  Just finished physical therapy.  Still with pain and weakness.  Following up with neurosurgery to discuss possible surgery.    Past NSAIDS/analgesics:  meloxicam, tramadol, hydrocodone-acetaminophen Past abortive triptans:  none Past abortive ergotamine:  none Past muscle relaxants:  none Past anti-emetic:  Flexeril, Robaxin Past antihypertensive medications:  amlodipine, telmisartan-HCTZ Past antidepressant medications:  none Past anticonvulsant medications:  none Past anti-CGRP:  none Past vitamins/Herbal/Supplements:  none Past antihistamines/decongestants:  none Other past therapies:  none  PAST MEDICAL HISTORY: Past Medical History:  Diagnosis Date   Allergy    Anginal pain (HCC)    Asthma    Complication of anesthesia    asthma attack after waking up from EGD in July 2022   Heart murmur    Hx of migraines    Neck sprain 06/04/2018   OSA on CPAP    PCOS (polycystic ovarian syndrome)    Pre-diabetes    Urticaria     MEDICATIONS: Current Outpatient Medications on File Prior to Visit  Medication Sig Dispense Refill   albuterol (VENTOLIN HFA) 108 (90 Base) MCG/ACT inhaler Inhale 2 puffs into the lungs every 4 (four) hours as needed for wheezing or shortness of breath (coughing fits). 18 g 1   budesonide-formoterol (SYMBICORT) 160-4.5 MCG/ACT inhaler Inhale 2 puffs into the lungs in the morning and at bedtime. with spacer and rinse mouth afterwards. 10.2 g 2   cetirizine (ZYRTEC) 10 MG tablet Take 10 mg by mouth daily.  EPINEPHrine 0.3 mg/0.3 mL IJ SOAJ injection Inject 0.3 mg into the muscle as needed for anaphylaxis.     gabapentin (NEURONTIN) 300 MG capsule Take 1 capsule by mouth 3 times a day with 2 capsules during the evening dose 120 capsule 1   HYDROcodone-acetaminophen  (NORCO/VICODIN) 5-325 MG tablet Take 0.5 tablets by mouth every 6 (six) hours as needed for moderate pain.     ketoconazole (NIZORAL) 2 % shampoo Apply to scalp twice a week for 8 weeks and then weekly thereafter 120 mL 11   medroxyPROGESTERone (PROVERA) 10 MG tablet Take 1 tablet (10 mg total) by mouth daily. For 7 days 21 tablet 3   methocarbamol (ROBAXIN) 750 MG tablet Take 1 tablet by mouth 3 times every day 90 tablet 2   metroNIDAZOLE (FLAGYL) 500 MG tablet Take 1 tablet (500 mg total) by mouth 2 (two) times daily. 14 tablet 0   montelukast (SINGULAIR) 10 MG tablet TAKE 1 TABLET BY MOUTH AT BEDTIME 30 tablet 3   polyethylene glycol (MIRALAX / GLYCOLAX) 17 g packet Take 17 g by mouth daily as needed.     sodium chloride (OCEAN) 0.65 % SOLN nasal spray Place 1 spray into both nostrils as needed for congestion.     telmisartan-hydrochlorothiazide (MICARDIS HCT) 80-25 MG tablet Take 1 tablet by mouth daily. 30 tablet 2   No current facility-administered medications on file prior to visit.    ALLERGIES: Allergies  Allergen Reactions   Clindamycin/Lincomycin Hives and Swelling   Gramineae Pollens Shortness Of Breath   Influenza Vaccines Anaphylaxis   Mushroom Extract Complex Swelling   Oxycodone Other (See Comments)    Hallucinations   Shellfish Allergy Swelling   Shrimp (Diagnostic) Swelling   Avocado Swelling   Labetalol     Other reaction(s): neurological reaction headache   Latex Itching   Lisinopril Cough    Other reaction(s): neurological reaction headache   Magnesium Other (See Comments)    Neurological reaction   Metformin Hcl Other (See Comments)    Hypoglycemia   Other Itching and Swelling    Ceaser Dressing- Anchovy (tongue swelling & itchy throat)   Pineapple Swelling   Soy Allergy Swelling    Tongue Swelling   Sumatriptan Other (See Comments)    Neurological reaction    FAMILY HISTORY: Family History  Problem Relation Age of Onset   Thyroid disease Mother     Healthy Sister    Hypertension Brother    Healthy Brother    Alzheimer's disease Paternal Grandmother    Parkinsonism Paternal Grandfather    Alzheimer's disease Paternal Grandfather       Objective:  *** General: No acute distress.  Patient appears ***-groomed.   Head:  Normocephalic/atraumatic Eyes:  Fundi examined but not visualized Neck: supple, no paraspinal tenderness, full range of motion Heart:  Regular rate and rhythm Lungs:  Clear to auscultation bilaterally Back: No paraspinal tenderness Neurological Exam: alert and oriented to person, place, and time.  Speech fluent and not dysarthric, language intact.  CN II-XII intact. Bulk and tone normal, muscle strength 5/5 throughout.  Sensation to light touch intact.  Deep tendon reflexes 2+ throughout, toes downgoing.  Finger to nose testing intact.  Gait normal, Romberg negative.   Metta Clines, DO  CC: ***

## 2021-08-20 ENCOUNTER — Ambulatory Visit: Payer: Managed Care, Other (non HMO) | Admitting: Neurology

## 2021-08-20 ENCOUNTER — Encounter: Payer: Self-pay | Admitting: Neurology

## 2021-08-20 DIAGNOSIS — Z029 Encounter for administrative examinations, unspecified: Secondary | ICD-10-CM

## 2022-07-17 ENCOUNTER — Other Ambulatory Visit: Payer: Self-pay | Admitting: Orthopedic Surgery

## 2022-08-04 NOTE — Pre-Procedure Instructions (Signed)
Surgical Instructions    Your procedure is scheduled on Aug 13, 2022.  Report to [facility] Main Entrance "A" at 5:30 A.M., then check in with the Admitting office.  Call this number if you have problems the morning of surgery:  [phone]  If you have any questions prior to your surgery date call [phone]: Open Monday-Friday 8am-4pm If you experience any cold or flu symptoms such as cough, fever, chills, shortness of breath, etc. between now and your scheduled surgery, please notify us at the above number.     Remember:  Do not eat after midnight the night before your surgery  You may drink clear liquids until 4:30 AM the morning of your surgery.   Clear liquids allowed are: Water, Non-Citrus Juices (without pulp), Carbonated Beverages, Clear Tea, Black Coffee Only (NO MILK, CREAM OR POWDERED CREAMER of any kind), and Gatorade.  Patient Instructions  The night before surgery:  No food after midnight. ONLY clear liquids after midnight  The day of surgery (if you have diabetes): Drink ONE (1) 12 oz G2 given to you in your pre admission testing appointment by 4:30 AM the morning of surgery. Drink in one sitting. Do not sip.  This drink was given to you during your hospital  pre-op appointment visit.  Nothing else to drink after completing the  12 oz bottle of G2.         If you have questions, please contact your surgeon's office.      Take these medicines the morning of surgery with A SIP OF WATER:  cetirizine (ZYRTEC)   fluticasone (VERAMYST) nasal spray   labetalol (NORMODYNE)   pregabalin (LYRICA)    May take these medicines IF NEEDED:  albuterol (VENTOLIN HFA) inhaler   butalbital-acetaminophen-caffeine (FIORICET)   EPINEPHrine Pen  HYDROcodone-acetaminophen (NORCO/VICODIN)   sodium chloride (OCEAN) nasal spray    DO NOT take metFORMIN (GLUCOPHAGE-XR) the morning of surgery.   As of today, STOP taking any Aspirin (unless otherwise instructed by your surgeon)  Aleve, Naproxen, Ibuprofen, Motrin, Advil, Goody's, BC's, all herbal medications, fish oil, and all vitamins. This includes your medication: meloxicam (MOBIC)                      Do NOT Smoke (Tobacco/Vaping) for 24 hours prior to your procedure.  If you use a CPAP at night, you may bring your mask/headgear for your overnight stay.   Contacts, glasses, piercing's, hearing aid's, dentures or partials may not be worn into surgery, please bring cases for these belongings.    For patients admitted to the hospital, discharge time will be determined by your treatment team.   Patients discharged the day of surgery will not be allowed to drive home, and someone needs to stay with them for 24 hours.  SURGICAL WAITING ROOM VISITATION Patients having surgery or a procedure may have no more than 2 support people in the waiting area - these visitors may rotate.   Children under the age of 19 must have an adult with them who is not the patient. If the patient needs to stay at the hospital during part of their recovery, the visitor guidelines for inpatient rooms apply. Pre-op nurse will coordinate an appropriate time for 1 support person to accompany patient in pre-op.  This support person may not rotate.   Please refer to the [facility] website for the visitor guidelines for Inpatients (after your surgery is over and you are in a regular room).  Special instructions:   [place]- Preparing For Surgery  Before surgery, you can play an important role. Because skin is not sterile, your skin needs to be as free of germs as possible. You can reduce the number of germs on your skin by washing with CHG (chlorahexidine gluconate) Soap before surgery.  CHG is an antiseptic cleaner which kills germs and bonds with the skin to continue killing germs even after washing.    Oral Hygiene is also important to reduce your risk of infection.  Remember - BRUSH YOUR TEETH THE MORNING OF SURGERY WITH YOUR REGULAR  TOOTHPASTE  Please follow the instructions on the handout you received at your pre-admission appointment about preparing for your upcoming surgery using the CHG surgical soap. If you have any questions or concerns, please call one of the numbers listed on the first page of this paperwork.   Day of Surgery: Take a shower with CHG soap. Do not wear jewelry or makeup Do not wear lotions, powders, perfumes/colognes, or deodorant. Do not shave 48 hours prior to surgery.  Men may shave face and neck. Do not bring valuables to the hospital.  [facility] is not responsible for any belongings or valuables. Do not wear nail polish, gel polish, artificial nails, or any other type of covering on natural nails (fingers and toes) If you have artificial nails or gel coating that need to be removed by a nail salon, please have this removed prior to surgery. Artificial nails or gel coating may interfere with anesthesia's ability to adequately monitor your vital signs. Wear Clean/Comfortable clothing the morning of surgery Remember to brush your teeth WITH YOUR REGULAR TOOTHPASTE.   Please read over the following fact sheets that you were given.    If you received a COVID test during your pre-op visit  it is requested that you wear a mask when out in public, stay away from anyone that may not be feeling well and notify your surgeon if you develop symptoms. If you have been in contact with anyone that has tested positive in the last 10 days please notify you surgeon.

## 2022-08-05 ENCOUNTER — Encounter (HOSPITAL_COMMUNITY): Payer: Self-pay

## 2022-08-05 ENCOUNTER — Other Ambulatory Visit: Payer: Self-pay

## 2022-08-05 ENCOUNTER — Encounter (HOSPITAL_COMMUNITY)
Admission: RE | Admit: 2022-08-05 | Discharge: 2022-08-05 | Disposition: A | Discharge status: Home / Self Care | Payer: Worker's Compensation | Source: Ambulatory Visit | Attending: Orthopedic Surgery | Admitting: Orthopedic Surgery

## 2022-08-05 VITALS — BP 125/84 | HR 91 | Temp 98.1°F | Resp 17 | Ht 62.0 in | Wt 224.0 lb

## 2022-08-05 DIAGNOSIS — Z01812 Encounter for preprocedural laboratory examination: Secondary | ICD-10-CM | POA: Diagnosis present

## 2022-08-05 DIAGNOSIS — Z01818 Encounter for other preprocedural examination: Secondary | ICD-10-CM

## 2022-08-05 HISTORY — DX: Other specified postprocedural states: R11.2

## 2022-08-05 HISTORY — DX: Headache, unspecified: R51.9

## 2022-08-05 HISTORY — DX: Other specified postprocedural states: Z98.890

## 2022-08-05 LAB — SURGICAL PCR SCREEN
MRSA, PCR: NEGATIVE
Staphylococcus aureus: NEGATIVE

## 2022-08-05 NOTE — Progress Notes (Signed)
PCP - Mady Haagensen, PA-C Cardiologist - Dr. Julieta Gutting  PPM/ICD - Denies Device Orders - n/a Rep Notified - n/a  Chest x-ray - n/a EKG - 07/15/2022 - Tracing Requested Stress Test - Denies ECHO - 03/10/2022 - Results Requested Cardiac Cath - Denies  Sleep Study - +OSA 2018. Pt wears CPAP nightly with automatic pressure settings.  No DM. Pt takes metformin for PCOS  Last dose of GLP1 agonist- n/a GLP1 instructions: n/a  Blood Thinner Instructions: n/a Aspirin Instructions: n/a  ERAS Protcol - Clear liquids until 0530 morning of surgery PRE-SURGERY Ensure or G2- Ensure given to pt with instructions  COVID TEST- n/a   Anesthesia review: Yes. EKG and Echo results requested. Difficult airway and heart mumur hx discussed with Shonna Chock, PA-C.  Patient denies shortness of breath, fever, cough and chest pain at PAT appointment. Pt denies any respiratory illness/infection in the last two months.   All instructions explained to the patient, with a verbal understanding of the material. Patient agrees to go over the instructions while at home for a better understanding. Patient also instructed to self quarantine after being tested for COVID-19. The opportunity to ask questions was provided.

## 2022-08-07 ENCOUNTER — Encounter (HOSPITAL_COMMUNITY): Payer: Self-pay

## 2022-08-07 NOTE — Progress Notes (Addendum)
Anesthesia Chart Review:  Case: [id number] Date/Time: 08/13/22 0815   Procedure: ANTERIOR CERVICAL DECOMPRESSION FUSION CERVICAL 5- CERVICAL 6, CERVICAL 6- CERVICAL 7 WITH INSTRUMENTATION AND ALLOGRAFT   Anesthesia type: General   Pre-op diagnosis: Left-sided cervical radiculopathy secondary to moderate neuroforaminal stenosis at C5-C6 and C6-C7 on the left   [facility]: [facility] ROOM 05 / [facility]   Surgeons: Estill Bamberg, MD       DISCUSSION: Patient is a 42 year old female scheduled for the above procedure.  History includes never smoker, post-operative N/V, asthma (reported bronchospasm after EGD 10/2020), PCOS, pre-diabetes, murmur (trace MR 03/2022 echo), migraines, OSA (s/p tonsillectomy, UPPP 04/14/17; uses CPAP), spinal surgery (left C6-7 laminotomy/foraminotomy 02/21/21).  Reported that Jessica Powell is "very sensitive" to anesthetics. BMI is consistent with morbid obesity.  Jessica Powell has never been told that Jessica Powell was a difficult intubation but there is a DIFFICULT INTUBATION FYI in [facility]. In review of 02/21/21 intubation records at [facility], anticipated DIFFICULT AIRWAY was documented.  Induction Type: IV induction Ventilation: Mask ventilation without difficulty and Oral airway inserted - appropriate to patient size Laryngoscope Size: Glidescope and 3 Grade View: Grade I Tube type: Oral Tube size: 7.0 mm Number of attempts: 1 Airway Equipment and Method: Oral airway, Rigid stylet and Video-laryngoscopy Dental Injury: Teeth and Oropharynx as per pre-operative assessment  Difficulty Due To: Difficulty was anticipated, Difficult Airway- due to reduced neck mobility and Difficult Airway- due to limited oral opening    Jessica Powell is followed by cardiologist Dr. Julieta Gutting at [facility] in [facility]. Currently last visit is on 05/15/22 for HTN follow-up with adequately controlled BP. Follow-up around 3 months is planned. Echo on 03/10/22 showed normal LV systolic function, EF > 70%, moderate concentric LVH,  trace MR. Jessica Powell previously had a non-ischemic ETT in 2017. In 2021 Jessica Powell had a coronary calcium scan which showed a total coronary calcium score of 0. BP 125/84 at PAT.  Jessica Powell said Jessica Powell is on metformin for PCOS. A1c 5.8% on 01/28/2022.  Jessica Powell had labs on 07/15/2022 that can be viewed in Care Everywhere. (Labs were done during ED visit for cervical radiculopathy.  Anesthesia team to evaluate on the day of surgery.   ADDENDUM 08/11/22 4:33 PM: Jessica Powell had a preoperative cardiology evaluation by Dr. Axel Filler today. He noted significant neck and left arm pain with c-spine surgery planned. Sometimes chest would hurt too with the pain but described it as lasting only a few seconds and "stabbing" sensation. He felt symptoms were atypical for cardiac etiology. EKG showed a relatively large R wave in V2 compared to V3 but otherwise R wave progression normal, no ischemic changes and suspected results were related to lead placement. Jessica Powell had had some hypotension since Valium ordered, so he lowered Micardis HCT to 40/12.5 mg daily. He wrote, "Preop cardiac exam: Jessica Powell is likely to require cervical spine decompression soon. I think the risk of cardiac complications with surgery is low and acceptable. Okay by me to proceed with surgery." Six month follow-up is planned.  VS: BP 125/84   Pulse 91   Temp 36.7 C   Resp 17   Ht 5\' 2"  (1.575 m)   Wt 101.6 kg   LMP 07/13/2022   SpO2 99%   BMI 40.97 kg/m   PROVIDERS: Glenna Durand, PA-C is PCP  Young Berry, MD is cardiologist Claretta Fraise, MD is neurologist ([facility])   LABS: Jessica Powell had labs on 07/15/22 that can be viewed in Care Everywhere. Results include: Normal CBC  with differential showing hemoglobin 13.0, hematocrit 39.5, platelet count 348; Normal CMP other than mildly elevated ALT of 71.  AST 32, creatinine 0.81, glucose 88, potassium 3.8, sodium 138.  Magnesium 2.0.  Negative qualitative beta hCG.  A1c 5.8% on 01/28/2022.   IMAGES: MRI C-spine 08/04/22 ([facility]): IMPRESSION:  1. Multilevel DDD. Interval enlargement left paracentral/foraminal disc extrusion at C6-7 that narrows the left lateral recess and proximal left neural foramen, impinging on the ventral left spinal cord and likely the exiting left C7 nerve root. This could account for worsening left-sided radicular symptoms. Correlate clinically. Otherwise no significant change as above.   MRI T-spine 08/04/22 ([facility]): IMPRESSION:  1. Very mild degenerative changes. No focal disc herniation or significant stenosis. No spinal cord or nerve root impingement.  2.  No acute bony abnormality.  3.  No intrinsic spinal cord abnormality.   CXR 07/15/22 ([facility]): IMPRESSION:  No radiographic evidence of acute cardiopulmonary disease.    EKG: 07/15/22 ([facility]): Tracing on shadow chart. Diagnosis Normal sinus rhythm  Possible Anterior infarct (cited on or before 30-Mar-2022)  Abnormal ECG  When compared with ECG of 30-Mar-2022 16:16,  Nonspecific T wave abnormality, worse in Lateral leads  Normal axis  Pieter Partridge ([id number]) on 07/15/2022 11:53:34 AM certifies that he/Jessica Powell has reviewed the ECG tracing and confirms the independent  interpretation is correct.    CV: Echo 03/10/22 ([facility]): Impression: Left Ventricle: Systolic function is normal. EF: >70%.   Left Ventricle: There is moderate concentric hypertrophy.  - Normal right ventricle size and systolic function. -Trace mitral valve regurgitation. - Normal right ventricular systolic pressure, less than 36 mmHg   Coronary Calcium Score 05/01/19 ([facility]): CORONARY ARTERIES: Normal origins.  Calcium Score ([facility]):  LM:    0  LAD:    0  LCx:    0  RCA:    0  ----------------------  Total:   0   IMPRESSION:  1. Total Coronary Calcium Score (CAC) = 0.    Exercise treadmill test 05/04/15 ([facility]): Patient exercised for and 54 sec on a standard Bruce protocol, achieving a maximum HR of 184 bpm (98% MPHR) and 12.8 METs,  consistent with excellent exercise capacity.  Patient did not experience any chest discomfort during the procedure, and stopped due to reaching target heart rate.  Normal  blood pressure and normal heart rate response to exercise.  No ischemic ST changes noted at rest or with exertion.  No arrhythmias noted.         Past Medical History:  Diagnosis Date   Allergy    Asthma    Complication of anesthesia    asthma attack after waking up from EGD in July 2022   Complication of anesthesia    Pt is very sensitive to anesthesia medications   Headache    Migraines   Heart murmur    Hx of migraines    Hypertension    Neck sprain 06/04/2018   OSA on CPAP    PCOS (polycystic ovarian syndrome)    PONV (postoperative nausea and vomiting)    Pre-diabetes    Urticaria     Past Surgical History:  Procedure Laterality Date   COLONOSCOPY  2022   POSTERIOR CERVICAL LAMINECTOMY Left 02/21/2021   Procedure: FORAMINOTOMY, Left  Cervical six-seven;  Surgeon: Bethann Goo, DO;  Location: [facility];  Service: Neurosurgery;  Laterality: Left;   TONSILLECTOMY  2019   UVULOPALATOPHARYNGOPLASTY  2019    MEDICATIONS:  cloNIDine (CATAPRES) 0.1 MG tablet   albuterol (VENTOLIN HFA) 108 (90 Base) MCG/ACT inhaler   budesonide-formoterol (SYMBICORT) 160-4.5 MCG/ACT inhaler   butalbital-acetaminophen-caffeine (FIORICET) 50-325-40 MG tablet   cetirizine (ZYRTEC) 10 MG tablet   diazepam (VALIUM) 2 MG tablet   EPINEPHrine 0.3 mg/0.3 mL IJ SOAJ injection   Fluocinolone Acetonide Scalp 0.01 % OIL   fluticasone (VERAMYST) 27.5 MCG/SPRAY nasal spray   gabapentin (NEURONTIN) 300 MG capsule   HYDROcodone-acetaminophen (NORCO/VICODIN) 5-325 MG tablet   ketoconazole (NIZORAL) 2 % shampoo   labetalol (NORMODYNE) 200 MG tablet   medroxyPROGESTERone (PROVERA) 10 MG tablet   meloxicam (MOBIC) 15 MG tablet   metFORMIN (GLUCOPHAGE-XR) 750 MG 24 hr tablet   methocarbamol (ROBAXIN) 750 MG tablet   metroNIDAZOLE (FLAGYL)  500 MG tablet   minoxidil (LONITEN) 2.5 MG tablet   montelukast (SINGULAIR) 10 MG tablet   polyethylene glycol (MIRALAX / GLYCOLAX) 17 g packet   pregabalin (LYRICA) 75 MG capsule   sodium chloride (OCEAN) 0.65 % SOLN nasal spray   telmisartan-hydrochlorothiazide (MICARDIS HCT) 80-25 MG tablet   No current facility-administered medications for this encounter.    Shonna Chock, PA-C Surgical Short Stay/Anesthesiology [facility] Phone [phone] [facility] Phone [phone] 08/07/2022 3:33 PM

## 2022-08-07 NOTE — Anesthesia Preprocedure Evaluation (Addendum)
Anesthesia Evaluation  Patient identified by MRN, date of birth, ID band Patient awake    Reviewed: Allergy & Precautions, NPO status , Patient's Chart, lab work & pertinent test results, reviewed documented beta blocker date and time   History of Anesthesia Complications (+) PONV and history of anesthetic complications  Airway Mallampati: III  TM Distance: >3 FB Neck ROM: Full    Dental  (+) Dental Advisory Given, Teeth Intact   Pulmonary asthma , sleep apnea and Continuous Positive Airway Pressure Ventilation  Rescue inhaler use every 79mo    Pulmonary exam normal breath sounds clear to auscultation       Cardiovascular hypertension (128/67 preop), Pt. on medications and Pt. on home beta blockers Normal cardiovascular exam Rhythm:Regular Rate:Normal     Neuro/Psych  Headaches PSYCHIATRIC DISORDERS Anxiety        GI/Hepatic negative GI ROS, Neg liver ROS,,,  Endo/Other  diabetes, Well Controlled, Type 2, Oral Hypoglycemic Agents  Morbid obesityBMI 41  Renal/GU negative Renal ROS  negative genitourinary   Musculoskeletal negative musculoskeletal ROS (+)    Abdominal  (+) + obese  Peds  Hematology negative hematology ROS (+)   Anesthesia Other Findings   Reproductive/Obstetrics negative OB ROS                             Anesthesia Physical Anesthesia Plan  ASA: 3  Anesthesia Plan: General   Post-op Pain Management: Tylenol PO (pre-op)*, Dilaudid IV and Ketamine IV*   Induction: Intravenous  PONV Risk Score and Plan: 4 or greater and Ondansetron, Dexamethasone, Midazolam, Treatment may vary due to age or medical condition and Scopolamine patch - Pre-op  Airway Management Planned: Oral ETT and Video Laryngoscope Planned  Additional Equipment: None  Intra-op Plan:   Post-operative Plan: Extubation in OR  Informed Consent: I have reviewed the patients History and Physical, chart,  labs and discussed the procedure including the risks, benefits and alternatives for the proposed anesthesia with the patient or authorized representative who has indicated his/her understanding and acceptance.     Dental advisory given  Plan Discussed with: CRNA  Anesthesia Plan Comments: (Last airway 2022:  Ventilation: Mask ventilation without difficulty and Oral airway inserted - appropriate to patient size Laryngoscope Size: Glidescope and 3 Grade View: Grade I Tube type: Oral Tube size: 7.0 mm Number of attempts: 1 Airway Equipment and Method: Oral airway, Rigid stylet and Video-laryngoscopy Dental Injury: Teeth and Oropharynx as per pre-operative assessment  Difficulty Due To: Difficulty was anticipated, Difficult Airway- due to reduced neck mobility and Difficult Airway- due to limited oral opening )       Anesthesia Quick Evaluation

## 2022-08-11 NOTE — Progress Notes (Signed)
Pt called today asking if she could shave her neck prior to surgery. I instructed her not to do so prior to surgery. Pt was instructed to arrive at 5:30 AM on Wednesday, 08/13/22. I looked at the schedule and 08/13/22 is a "late start" day. I instructed her to arrive at 6:30 AM.

## 2022-08-13 ENCOUNTER — Ambulatory Visit (HOSPITAL_COMMUNITY)
Admission: RE | Disposition: A | Discharge status: Home / Self Care | Payer: Self-pay | Source: Home / Self Care | Attending: Orthopedic Surgery

## 2022-08-13 ENCOUNTER — Other Ambulatory Visit: Payer: Self-pay

## 2022-08-13 ENCOUNTER — Ambulatory Visit (HOSPITAL_COMMUNITY)
Admission: RE | Admit: 2022-08-13 | Discharge: 2022-08-13 | Disposition: A | Discharge status: Home / Self Care | Payer: Worker's Compensation | Attending: Orthopedic Surgery | Admitting: Orthopedic Surgery

## 2022-08-13 ENCOUNTER — Ambulatory Visit (HOSPITAL_COMMUNITY): Payer: Worker's Compensation | Admitting: Vascular Surgery

## 2022-08-13 ENCOUNTER — Encounter (HOSPITAL_COMMUNITY): Payer: Self-pay | Admitting: Orthopedic Surgery

## 2022-08-13 ENCOUNTER — Ambulatory Visit (HOSPITAL_COMMUNITY): Payer: Managed Care, Other (non HMO)

## 2022-08-13 ENCOUNTER — Ambulatory Visit (HOSPITAL_BASED_OUTPATIENT_CLINIC_OR_DEPARTMENT_OTHER): Payer: Worker's Compensation | Admitting: General Practice

## 2022-08-13 DIAGNOSIS — Z7951 Long term (current) use of inhaled steroids: Secondary | ICD-10-CM | POA: Insufficient documentation

## 2022-08-13 DIAGNOSIS — E282 Polycystic ovarian syndrome: Secondary | ICD-10-CM | POA: Insufficient documentation

## 2022-08-13 DIAGNOSIS — I10 Essential (primary) hypertension: Secondary | ICD-10-CM | POA: Insufficient documentation

## 2022-08-13 DIAGNOSIS — J45909 Unspecified asthma, uncomplicated: Secondary | ICD-10-CM | POA: Diagnosis not present

## 2022-08-13 DIAGNOSIS — Z79899 Other long term (current) drug therapy: Secondary | ICD-10-CM | POA: Insufficient documentation

## 2022-08-13 DIAGNOSIS — M25471 Effusion, right ankle: Secondary | ICD-10-CM

## 2022-08-13 DIAGNOSIS — Z6841 Body Mass Index (BMI) 40.0 and over, adult: Secondary | ICD-10-CM | POA: Diagnosis not present

## 2022-08-13 DIAGNOSIS — F419 Anxiety disorder, unspecified: Secondary | ICD-10-CM | POA: Diagnosis not present

## 2022-08-13 DIAGNOSIS — E119 Type 2 diabetes mellitus without complications: Secondary | ICD-10-CM | POA: Diagnosis not present

## 2022-08-13 DIAGNOSIS — G4733 Obstructive sleep apnea (adult) (pediatric): Secondary | ICD-10-CM | POA: Insufficient documentation

## 2022-08-13 DIAGNOSIS — M5412 Radiculopathy, cervical region: Secondary | ICD-10-CM | POA: Insufficient documentation

## 2022-08-13 DIAGNOSIS — Z793 Long term (current) use of hormonal contraceptives: Secondary | ICD-10-CM | POA: Diagnosis not present

## 2022-08-13 DIAGNOSIS — M4802 Spinal stenosis, cervical region: Secondary | ICD-10-CM | POA: Diagnosis not present

## 2022-08-13 DIAGNOSIS — Z7984 Long term (current) use of oral hypoglycemic drugs: Secondary | ICD-10-CM | POA: Insufficient documentation

## 2022-08-13 HISTORY — PX: ANTERIOR CERVICAL DECOMP/DISCECTOMY FUSION: SHX1161

## 2022-08-13 LAB — POCT PREGNANCY, URINE: Preg Test, Ur: NEGATIVE

## 2022-08-13 SURGERY — ANTERIOR CERVICAL DECOMPRESSION/DISCECTOMY FUSION 2 LEVELS
Anesthesia: General

## 2022-08-13 MED ORDER — PROPOFOL 500 MG/50ML IV EMUL
INTRAVENOUS | Status: DC | PRN
  Administered 2022-08-13: 25 ug/kg/min via INTRAVENOUS

## 2022-08-13 MED ORDER — ROCURONIUM BROMIDE 10 MG/ML (PF) SYRINGE
PREFILLED_SYRINGE | INTRAVENOUS | Status: DC | PRN
  Administered 2022-08-13: 70 mg via INTRAVENOUS
  Administered 2022-08-13: 30 mg via INTRAVENOUS
  Administered 2022-08-13: 10 mg via INTRAVENOUS

## 2022-08-13 MED ORDER — LACTATED RINGERS IV SOLN: INTRAVENOUS | Status: DC

## 2022-08-13 MED ORDER — BUPIVACAINE-EPINEPHRINE 0.25% -1:200000 IJ SOLN
INTRAMUSCULAR | Status: DC | PRN
  Administered 2022-08-13: 5 mL

## 2022-08-13 MED ORDER — HYDROCODONE-ACETAMINOPHEN 7.5-325 MG PO TABS
ORAL_TABLET | ORAL | Status: AC
  Filled 2022-08-13: qty 1

## 2022-08-13 MED ORDER — EPHEDRINE SULFATE-NACL 50-0.9 MG/10ML-% IV SOSY
PREFILLED_SYRINGE | INTRAVENOUS | Status: DC | PRN
  Administered 2022-08-13 (×2): 5 mg via INTRAVENOUS

## 2022-08-13 MED ORDER — DEXAMETHASONE SODIUM PHOSPHATE 10 MG/ML IJ SOLN
INTRAMUSCULAR | Status: AC
  Filled 2022-08-13: qty 1

## 2022-08-13 MED ORDER — GELATIN ABSORBABLE 100 EX MISC
CUTANEOUS | Status: DC | PRN
  Administered 2022-08-13: 1

## 2022-08-13 MED ORDER — AMISULPRIDE (ANTIEMETIC) 5 MG/2ML IV SOLN
INTRAVENOUS | Status: AC
  Filled 2022-08-13: qty 4

## 2022-08-13 MED ORDER — MIDAZOLAM HCL 2 MG/2ML IJ SOLN
INTRAMUSCULAR | Status: AC
  Filled 2022-08-13: qty 2

## 2022-08-13 MED ORDER — LIDOCAINE 2% (20 MG/ML) 5 ML SYRINGE
INTRAMUSCULAR | Status: AC
  Filled 2022-08-13: qty 5

## 2022-08-13 MED ORDER — DEXMEDETOMIDINE HCL IN NACL 80 MCG/20ML IV SOLN
INTRAVENOUS | Status: AC
  Filled 2022-08-13: qty 20

## 2022-08-13 MED ORDER — PROPOFOL 10 MG/ML IV BOLUS
INTRAVENOUS | Status: AC
  Filled 2022-08-13: qty 20

## 2022-08-13 MED ORDER — BUPIVACAINE-EPINEPHRINE (PF) 0.25% -1:200000 IJ SOLN
INTRAMUSCULAR | Status: AC
  Filled 2022-08-13: qty 30

## 2022-08-13 MED ORDER — CHLORHEXIDINE GLUCONATE 0.12 % MT SOLN
15.0000 mL | Freq: Once | OROMUCOSAL | Status: AC
  Administered 2022-08-13: 15 mL via OROMUCOSAL
  Filled 2022-08-13: qty 15

## 2022-08-13 MED ORDER — THROMBIN 20000 UNITS EX KIT
PACK | CUTANEOUS | Status: AC
  Filled 2022-08-13: qty 1

## 2022-08-13 MED ORDER — PROPOFOL 10 MG/ML IV BOLUS
INTRAVENOUS | Status: DC | PRN
  Administered 2022-08-13: 200 mg via INTRAVENOUS

## 2022-08-13 MED ORDER — SUGAMMADEX SODIUM 200 MG/2ML IV SOLN
INTRAVENOUS | Status: DC | PRN
  Administered 2022-08-13: 300 mg via INTRAVENOUS

## 2022-08-13 MED ORDER — POVIDONE-IODINE 7.5 % EX SOLN
Freq: Once | CUTANEOUS | Status: DC
  Filled 2022-08-13: qty 118

## 2022-08-13 MED ORDER — ORAL CARE MOUTH RINSE: 15.0000 mL | Freq: Once | OROMUCOSAL | Status: AC

## 2022-08-13 MED ORDER — LIDOCAINE 2% (20 MG/ML) 5 ML SYRINGE
INTRAMUSCULAR | Status: DC | PRN
  Administered 2022-08-13: 60 mg via INTRAVENOUS

## 2022-08-13 MED ORDER — SCOPOLAMINE 1 MG/3DAYS TD PT72
MEDICATED_PATCH | TRANSDERMAL | Status: AC
  Administered 2022-08-13: 1.5 mg via TRANSDERMAL
  Filled 2022-08-13: qty 1

## 2022-08-13 MED ORDER — PHENYLEPHRINE 80 MCG/ML (10ML) SYRINGE FOR IV PUSH (FOR BLOOD PRESSURE SUPPORT)
PREFILLED_SYRINGE | INTRAVENOUS | Status: AC
  Filled 2022-08-13: qty 10

## 2022-08-13 MED ORDER — FENTANYL CITRATE (PF) 250 MCG/5ML IJ SOLN
INTRAMUSCULAR | Status: AC
  Filled 2022-08-13: qty 5

## 2022-08-13 MED ORDER — ONDANSETRON HCL 4 MG/2ML IJ SOLN
INTRAMUSCULAR | Status: DC | PRN
  Administered 2022-08-13: 4 mg via INTRAVENOUS

## 2022-08-13 MED ORDER — ACETAMINOPHEN 500 MG PO TABS
1000.0000 mg | ORAL_TABLET | Freq: Once | ORAL | Status: AC
  Administered 2022-08-13: 500 mg via ORAL
  Filled 2022-08-13: qty 2

## 2022-08-13 MED ORDER — ROCURONIUM BROMIDE 10 MG/ML (PF) SYRINGE
PREFILLED_SYRINGE | INTRAVENOUS | Status: AC
  Filled 2022-08-13: qty 10

## 2022-08-13 MED ORDER — HYDROCODONE-ACETAMINOPHEN 7.5-325 MG PO TABS
1.0000 | ORAL_TABLET | Freq: Once | ORAL | Status: AC | PRN
  Administered 2022-08-13: 1 via ORAL

## 2022-08-13 MED ORDER — FENTANYL CITRATE (PF) 250 MCG/5ML IJ SOLN
INTRAMUSCULAR | Status: DC | PRN
  Administered 2022-08-13 (×2): 50 ug via INTRAVENOUS
  Administered 2022-08-13: 100 ug via INTRAVENOUS

## 2022-08-13 MED ORDER — METOCLOPRAMIDE HCL 5 MG/ML IJ SOLN
INTRAMUSCULAR | Status: DC | PRN
  Administered 2022-08-13: 10 mg via INTRAVENOUS

## 2022-08-13 MED ORDER — KETAMINE HCL-SODIUM CHLORIDE 100-0.9 MG/10ML-% IV SOSY
PREFILLED_SYRINGE | INTRAVENOUS | Status: DC | PRN
  Administered 2022-08-13: 10 mg via INTRAVENOUS
  Administered 2022-08-13: 40 mg via INTRAVENOUS

## 2022-08-13 MED ORDER — DIPHENHYDRAMINE HCL 50 MG/ML IJ SOLN
INTRAMUSCULAR | Status: DC | PRN
  Administered 2022-08-13: 12.5 mg via INTRAVENOUS

## 2022-08-13 MED ORDER — ONDANSETRON HCL 4 MG/2ML IJ SOLN: 4.0000 mg | Freq: Once | INTRAMUSCULAR | Status: DC | PRN

## 2022-08-13 MED ORDER — MIDAZOLAM HCL 2 MG/2ML IJ SOLN
INTRAMUSCULAR | Status: DC | PRN
  Administered 2022-08-13: 2 mg via INTRAVENOUS

## 2022-08-13 MED ORDER — KETOROLAC TROMETHAMINE 30 MG/ML IJ SOLN
INTRAMUSCULAR | Status: AC
  Filled 2022-08-13: qty 1

## 2022-08-13 MED ORDER — KETOROLAC TROMETHAMINE 30 MG/ML IJ SOLN
30.0000 mg | Freq: Once | INTRAMUSCULAR | Status: AC | PRN
  Administered 2022-08-13: 30 mg via INTRAVENOUS

## 2022-08-13 MED ORDER — HYDROMORPHONE HCL 1 MG/ML IJ SOLN: 0.2500 mg | INTRAMUSCULAR | Status: DC | PRN

## 2022-08-13 MED ORDER — MEPERIDINE HCL 25 MG/ML IJ SOLN: 6.2500 mg | INTRAMUSCULAR | Status: DC | PRN

## 2022-08-13 MED ORDER — DIAZEPAM 2 MG PO TABS: 2.0000 mg | ORAL_TABLET | Freq: Four times a day (QID) | ORAL | 0 refills | Status: AC | PRN

## 2022-08-13 MED ORDER — SCOPOLAMINE 1 MG/3DAYS TD PT72
1.0000 | MEDICATED_PATCH | TRANSDERMAL | Status: DC
  Filled 2022-08-13: qty 1

## 2022-08-13 MED ORDER — AMISULPRIDE (ANTIEMETIC) 5 MG/2ML IV SOLN
10.0000 mg | Freq: Once | INTRAVENOUS | Status: AC | PRN
  Administered 2022-08-13: 10 mg via INTRAVENOUS

## 2022-08-13 MED ORDER — 0.9 % SODIUM CHLORIDE (POUR BTL) OPTIME
TOPICAL | Status: DC | PRN
  Administered 2022-08-13: 1000 mL

## 2022-08-13 MED ORDER — ONDANSETRON HCL 4 MG/2ML IJ SOLN
INTRAMUSCULAR | Status: AC
  Filled 2022-08-13: qty 2

## 2022-08-13 MED ORDER — PHENYLEPHRINE 80 MCG/ML (10ML) SYRINGE FOR IV PUSH (FOR BLOOD PRESSURE SUPPORT)
PREFILLED_SYRINGE | INTRAVENOUS | Status: DC | PRN
  Administered 2022-08-13: 160 ug via INTRAVENOUS
  Administered 2022-08-13 (×3): 80 ug via INTRAVENOUS
  Administered 2022-08-13: 160 ug via INTRAVENOUS
  Administered 2022-08-13: 80 ug via INTRAVENOUS
  Administered 2022-08-13: 160 ug via INTRAVENOUS

## 2022-08-13 MED ORDER — PREGABALIN 75 MG PO CAPS: 75.0000 mg | ORAL_CAPSULE | Freq: Two times a day (BID) | ORAL | 0 refills | Status: AC

## 2022-08-13 MED ORDER — CEFAZOLIN SODIUM-DEXTROSE 2-4 GM/100ML-% IV SOLN
2.0000 g | INTRAVENOUS | Status: AC
  Administered 2022-08-13: 2 g via INTRAVENOUS
  Filled 2022-08-13: qty 100

## 2022-08-13 MED ORDER — HYDROCODONE-ACETAMINOPHEN 5-325 MG PO TABS: 1.0000 | ORAL_TABLET | Freq: Four times a day (QID) | ORAL | 0 refills | Status: AC | PRN

## 2022-08-13 MED ORDER — DIPHENHYDRAMINE HCL 50 MG/ML IJ SOLN
INTRAMUSCULAR | Status: AC
  Filled 2022-08-13: qty 1

## 2022-08-13 MED ORDER — METOCLOPRAMIDE HCL 5 MG/ML IJ SOLN
INTRAMUSCULAR | Status: AC
  Filled 2022-08-13: qty 2

## 2022-08-13 MED ORDER — EPHEDRINE 5 MG/ML INJ
INTRAVENOUS | Status: AC
  Filled 2022-08-13: qty 5

## 2022-08-13 MED ORDER — KETAMINE HCL 50 MG/5ML IJ SOSY
PREFILLED_SYRINGE | INTRAMUSCULAR | Status: AC
  Filled 2022-08-13: qty 10

## 2022-08-13 MED ORDER — PHENYLEPHRINE HCL-NACL 20-0.9 MG/250ML-% IV SOLN
INTRAVENOUS | Status: DC | PRN
  Administered 2022-08-13: 40 ug/min via INTRAVENOUS

## 2022-08-13 MED ORDER — THROMBIN 20000 UNITS EX SOLR
CUTANEOUS | Status: DC | PRN
  Administered 2022-08-13: 20000 [IU] via TOPICAL

## 2022-08-13 SURGICAL SUPPLY — 75 items
AGENT HMST KT MTR STRL THRMB (HEMOSTASIS)
APL SKNCLS STERI-STRIP NONHPOA (GAUZE/BANDAGES/DRESSINGS) ×1
BAG COUNTER SPONGE SURGICOUNT (BAG) ×1 IMPLANT
BAG SPNG CNTER NS LX DISP (BAG) ×1
BENZOIN TINCTURE PRP APPL 2/3 (GAUZE/BANDAGES/DRESSINGS) ×1 IMPLANT
BIT DRILL NEURO 2X3.1 SFT TUCH (MISCELLANEOUS) ×1 IMPLANT
BIT DRILL SRG 14X2.2XFLT CHK (BIT) IMPLANT
BIT DRL SRG 14X2.2XFLT CHK (BIT) ×1
BLADE CLIPPER SURG (BLADE) ×1 IMPLANT
BLADE SURG 15 STRL LF DISP TIS (BLADE) ×1 IMPLANT
BLADE SURG 15 STRL SS (BLADE) ×1
BONE VIVIGEN FORMABLE 1.3CC (Bone Implant) ×1 IMPLANT
COLLAR CERV LO CONTOUR FIRM DE (SOFTGOODS) IMPLANT
CORD BIPOLAR FORCEPS 12FT (ELECTRODE) ×1 IMPLANT
COVER SURGICAL LIGHT HANDLE (MISCELLANEOUS) ×1 IMPLANT
DEVICE ENDSKLTN IMPLANT SM 7MM (Cage) IMPLANT
DRAIN JACKSON RD 7FR 3/32 (WOUND CARE) IMPLANT
DRAPE C-ARM 42X72 X-RAY (DRAPES) ×1 IMPLANT
DRAPE POUCH INSTRU U-SHP 10X18 (DRAPES) ×1 IMPLANT
DRAPE SURG 17X23 STRL (DRAPES) ×4 IMPLANT
DRILL BIT SKYLINE 14MM (BIT) ×1
DRILL NEURO 2X3.1 SOFT TOUCH (MISCELLANEOUS) ×1
DURAPREP 26ML APPLICATOR (WOUND CARE) ×1 IMPLANT
ELECT COATED BLADE 2.86 ST (ELECTRODE) ×1 IMPLANT
ELECT REM PT RETURN 9FT ADLT (ELECTROSURGICAL) ×1
ELECTRODE REM PT RTRN 9FT ADLT (ELECTROSURGICAL) ×1 IMPLANT
ENDOSKELETON IMPLANT SM 7MM (Cage) ×2 IMPLANT
EVACUATOR SILICONE 100CC (DRAIN) IMPLANT
GAUZE 4X4 16PLY ~~LOC~~+RFID DBL (SPONGE) ×1 IMPLANT
GAUZE SPONGE 4X4 12PLY STRL (GAUZE/BANDAGES/DRESSINGS) ×1 IMPLANT
GLOVE BIO SURGEON STRL SZ 6.5 (GLOVE) ×1 IMPLANT
GLOVE BIO SURGEON STRL SZ8 (GLOVE) ×1 IMPLANT
GLOVE BIOGEL PI IND STRL 7.0 (GLOVE) ×2 IMPLANT
GLOVE BIOGEL PI IND STRL 8 (GLOVE) ×1 IMPLANT
GLOVE SURG ENC MOIS LTX SZ6.5 (GLOVE) ×1 IMPLANT
GOWN STRL REUS W/ TWL LRG LVL3 (GOWN DISPOSABLE) ×1 IMPLANT
GOWN STRL REUS W/ TWL XL LVL3 (GOWN DISPOSABLE) ×1 IMPLANT
GOWN STRL REUS W/TWL LRG LVL3 (GOWN DISPOSABLE) ×1
GOWN STRL REUS W/TWL XL LVL3 (GOWN DISPOSABLE) ×1
GRAFT BNE MATRIX VG FRMBL SM 1 (Bone Implant) IMPLANT
IV CATH 14GX2 1/4 (CATHETERS) ×1 IMPLANT
KIT BASIN OR (CUSTOM PROCEDURE TRAY) ×1 IMPLANT
KIT TURNOVER KIT B (KITS) ×1 IMPLANT
MANIFOLD NEPTUNE II (INSTRUMENTS) ×1 IMPLANT
NDL PRECISIONGLIDE 27X1.5 (NEEDLE) ×1 IMPLANT
NDL SPNL 20GX3.5 QUINCKE YW (NEEDLE) ×1 IMPLANT
NEEDLE PRECISIONGLIDE 27X1.5 (NEEDLE) ×1 IMPLANT
NEEDLE SPNL 20GX3.5 QUINCKE YW (NEEDLE) ×1 IMPLANT
NS IRRIG 1000ML POUR BTL (IV SOLUTION) ×1 IMPLANT
PACK ORTHO CERVICAL (CUSTOM PROCEDURE TRAY) ×1 IMPLANT
PAD ARMBOARD 7.5X6 YLW CONV (MISCELLANEOUS) ×2 IMPLANT
PATTIES SURGICAL .5 X.5 (GAUZE/BANDAGES/DRESSINGS) IMPLANT
PATTIES SURGICAL .5 X1 (DISPOSABLE) ×1 IMPLANT
PIN DISTRACTION 14 (PIN) IMPLANT
PLATE TWO LEVEL SKYLINE 30MM (Plate) IMPLANT
POSITIONER HEAD DONUT 9IN (MISCELLANEOUS) ×1 IMPLANT
SCREW SKYLINE VAR OS 14MM (Screw) IMPLANT
SPIKE FLUID TRANSFER (MISCELLANEOUS) ×1 IMPLANT
SPONGE INTESTINAL PEANUT (DISPOSABLE) ×1 IMPLANT
SPONGE SURGIFOAM ABS GEL 100 (HEMOSTASIS) ×1 IMPLANT
STRIP CLOSURE SKIN 1/2X4 (GAUZE/BANDAGES/DRESSINGS) ×1 IMPLANT
SURGIFLO W/THROMBIN 8M KIT (HEMOSTASIS) IMPLANT
SUT MNCRL AB 4-0 PS2 18 (SUTURE) ×1 IMPLANT
SUT SILK 4 0 (SUTURE)
SUT SILK 4-0 18XBRD TIE 12 (SUTURE) IMPLANT
SUT VIC AB 2-0 CT2 18 VCP726D (SUTURE) ×1 IMPLANT
SYR BULB IRRIG 60ML STRL (SYRINGE) ×1 IMPLANT
SYR CONTROL 10ML LL (SYRINGE) ×3 IMPLANT
TAPE CLOTH 4X10 WHT NS (GAUZE/BANDAGES/DRESSINGS) ×1 IMPLANT
TAPE CLOTH SURG 4X10 WHT LF (GAUZE/BANDAGES/DRESSINGS) IMPLANT
TAPE UMBILICAL 1/8X30 (MISCELLANEOUS) ×2 IMPLANT
TOWEL GREEN STERILE (TOWEL DISPOSABLE) ×1 IMPLANT
TOWEL GREEN STERILE FF (TOWEL DISPOSABLE) ×1 IMPLANT
WATER STERILE IRR 1000ML POUR (IV SOLUTION) ×1 IMPLANT
YANKAUER SUCT BULB TIP NO VENT (SUCTIONS) ×1 IMPLANT

## 2022-08-13 NOTE — H&P (Signed)
PREOPERATIVE H&P  Chief Complaint: Left arm pain  HPI: Jessica Powell is a 42 y.o. female who presents with ongoing pain in the left arm  MRI reveals stenosis at C5/6 and C6/7   Patient has failed multiple forms of conservative care and continues to have pain (see office notes for additional details regarding the patient's full course of treatment)  Past Medical History:  Diagnosis Date   Allergy    Asthma    Complication of anesthesia    asthma attack after waking up from EGD in July 2022   Complication of anesthesia    Pt is very sensitive to anesthesia medications   Headache    Migraines   Heart murmur    Hx of migraines    Hypertension    Neck sprain 06/04/2018   OSA on CPAP    PCOS (polycystic ovarian syndrome)    PONV (postoperative nausea and vomiting)    Pre-diabetes    Urticaria    Past Surgical History:  Procedure Laterality Date   COLONOSCOPY  2022   POSTERIOR CERVICAL LAMINECTOMY Left 02/21/2021   Procedure: FORAMINOTOMY, Left  Cervical six-seven;  Surgeon: Bethann Goo, DO;  Location: [facility] OR;  Service: Neurosurgery;  Laterality: Left;   TONSILLECTOMY  2019   UVULOPALATOPHARYNGOPLASTY  2019   Social History   Socioeconomic History   Marital status: Married    Spouse name: Jannette Spanner   Number of children: 0   Years of education: 16   Highest education level: Bachelor's degree (e.g., BA, AB, BS)  Occupational History   Not on file  Tobacco Use   Smoking status: Never   Smokeless tobacco: Never  Vaping Use   Vaping Use: Never used  Substance and Sexual Activity   Alcohol use: Not Currently   Drug use: Never   Sexual activity: Yes    Partners: Male    Birth control/protection: None  Other Topics Concern   Not on file  Social History Narrative   Right handed   Social Determinants of Health   Financial Resource Strain: Not on file  Food Insecurity: Not on file  Transportation Needs: Not on file  Physical Activity: Not on file   Stress: Not on file  Social Connections: Not on file   Family History  Problem Relation Age of Onset   Thyroid disease Mother    Healthy Sister    Hypertension Brother    Healthy Brother    Alzheimer's disease Paternal Grandmother    Parkinsonism Paternal Grandfather    Alzheimer's disease Paternal Grandfather    Allergies  Allergen Reactions   Clindamycin/Lincomycin Hives and Swelling   Gramineae Pollens Shortness Of Breath   Influenza Vaccines Anaphylaxis   Mushroom Extract Complex Swelling   Oxycodone Other (See Comments)    Hallucinations   Shellfish Allergy Swelling   Shrimp (Diagnostic) Swelling   Avocado Swelling   Labetalol     Other reaction(s): neurological reaction headache   Latex Itching   Lisinopril Cough    Other reaction(s): neurological reaction headache   Magnesium Other (See Comments)    Neurological reaction   Metformin Hcl Other (See Comments)    Hypoglycemia   Other Itching and Swelling    Ceaser Dressing- Anchovy (tongue swelling & itchy throat). Tree Nuts   Pineapple Swelling   Soy Allergy Swelling    Tongue Swelling   Sumatriptan Other (See Comments)    Neurological reaction   Prior to Admission medications   Medication Sig Start  Date End Date Taking? Authorizing Provider  albuterol (VENTOLIN HFA) 108 (90 Base) MCG/ACT inhaler Inhale 2 puffs into the lungs every 4 (four) hours as needed for wheezing or shortness of breath (coughing fits). 02/04/21  Yes Ellamae Sia, DO  butalbital-acetaminophen-caffeine (FIORICET) [phone] MG tablet Take 1 tablet by mouth daily as needed for migraine. 07/17/22  Yes [provider]  cetirizine (ZYRTEC) 10 MG tablet Take 10 mg by mouth daily.   Yes [provider]  diazepam (VALIUM) 2 MG tablet Take 2 mg by mouth every 8 (eight) hours as needed for muscle spasms.   Yes [provider]  EPINEPHrine 0.3 mg/0.3 mL IJ SOAJ injection Inject 0.3 mg into the muscle as needed for anaphylaxis.  09/03/20  Yes [provider]  Fluocinolone Acetonide Scalp 0.01 % OIL Apply 1 Application topically 2 (two) times a week. 07/17/22  Yes [provider]  fluticasone (VERAMYST) 27.5 MCG/SPRAY nasal spray Place 2 sprays into the nose daily.   Yes [provider]  HYDROcodone-acetaminophen (NORCO/VICODIN) 5-325 MG tablet Take 0.5 tablets by mouth every 6 (six) hours as needed for moderate pain. 03/16/21  Yes [provider]  ketoconazole (NIZORAL) 2 % shampoo Apply to scalp twice a week for 8 weeks and then weekly thereafter 12/18/20  Yes Jerrol Banana, MD  labetalol (NORMODYNE) 200 MG tablet Take 200 mg by mouth 2 (two) times daily. 05/15/22  Yes [provider]  meloxicam (MOBIC) 15 MG tablet Take 15 mg by mouth daily.   Yes [provider]  metFORMIN (GLUCOPHAGE-XR) 750 MG 24 hr tablet Take 750 mg by mouth in the morning and at bedtime. 04/10/22  Yes [provider]  minoxidil (LONITEN) 2.5 MG tablet Take 1.25 mg by mouth daily.   Yes [provider]  montelukast (SINGULAIR) 10 MG tablet TAKE 1 TABLET BY MOUTH AT BEDTIME 08/08/21  Yes Jerrol Banana, MD  pregabalin (LYRICA) 75 MG capsule Take 75 mg by mouth 2 (two) times daily. 07/02/22  Yes [provider]  sodium chloride (OCEAN) 0.65 % SOLN nasal spray Place 1 spray into both nostrils as needed for congestion.   Yes [provider]  telmisartan-hydrochlorothiazide (MICARDIS HCT) 80-25 MG tablet Take 1 tablet by mouth daily as needed (blood pressure of 120 or above). 05/28/21  Yes Jerrol Banana, MD  budesonide-formoterol [facility]) 160-4.5 MCG/ACT inhaler Inhale 2 puffs into the lungs in the morning and at bedtime. with spacer and rinse mouth afterwards. Patient not taking: Reported on 07/30/2022 03/22/21   Ellamae Sia, DO  cloNIDine (CATAPRES) 0.1 MG tablet Take 0.1 mg by mouth as needed (For high BP>160/Migraines).    [provider]  gabapentin  (NEURONTIN) 300 MG capsule Take 1 capsule by mouth 3 times a day with 2 capsules during the evening dose Patient not taking: Reported on 07/30/2022 05/27/21   Council Mechanic, NP  medroxyPROGESTERone (PROVERA) 10 MG tablet Take 1 tablet (10 mg total) by mouth daily. For 7 days 05/02/21   Milas Hock, MD  methocarbamol (ROBAXIN) 750 MG tablet Take 1 tablet by mouth 3 times every day Patient not taking: Reported on 07/30/2022 05/27/21     metroNIDAZOLE (FLAGYL) 500 MG tablet Take 1 tablet (500 mg total) by mouth 2 (two) times daily. Patient not taking: Reported on 07/30/2022 05/03/21   Milas Hock, MD  polyethylene glycol (MIRALAX / GLYCOLAX) 17 g packet Take 17 g by mouth daily as needed.    [provider]  All other systems have been reviewed and were otherwise negative with the exception of those mentioned in the HPI and as above.  Physical Exam: Vitals:   08/13/22 0706  BP: 128/67  Pulse: 98  Resp: 20  Temp: 98.2 F (36.8 C)  SpO2: 96%    Body mass index is 41.15 kg/m.  General: Alert, no acute distress Cardiovascular: No pedal edema Respiratory: No cyanosis, no use of accessory musculature Skin: No lesions in the area of chief complaint Neurologic: Sensation intact distally Psychiatric: Patient is competent for consent with normal mood and affect Lymphatic: No axillary or cervical lymphadenopathy   Assessment/Plan: Left-sided cervical radiculopathy secondary to moderate neuroforaminal stenosis at C5-C6 and C6-C7 on the left Plan for Procedure(s): ANTERIOR CERVICAL DECOMPRESSION FUSION CERVICAL 5- CERVICAL 6, CERVICAL 6- CERVICAL 7 WITH INSTRUMENTATION AND ALLOGRAFT   Jackelyn Hoehn, MD 08/13/2022 7:16 AM

## 2022-08-13 NOTE — Transfer of Care (Signed)
Immediate Anesthesia Transfer of Care Note  Patient: Jessica Powell  Procedure(s) Performed: ANTERIOR CERVICAL DECOMPRESSION FUSION CERVICAL Five- CERVICAL Six, CERVICAL Six- CERVICAL Seven WITH INSTRUMENTATION AND ALLOGRAFT  Patient Location: PACU  Anesthesia Type:General  Level of Consciousness: awake and alert   Airway & Oxygen Therapy: Patient Spontanous Breathing and Patient connected to face mask oxygen  Post-op Assessment: Report given to RN and Post -op Vital signs reviewed and stable  Post vital signs: Reviewed and stable  Last Vitals:  Vitals Value Taken Time  BP 115/74 08/13/22 1125  Temp 37.1 C 08/13/22 1125  Pulse 95 08/13/22 1129  Resp 19 08/13/22 1129  SpO2 96 % 08/13/22 1129  Vitals shown include unvalidated device data.  Last Pain:  Vitals:   08/13/22 0742  TempSrc:   PainSc: 6          Complications: No notable events documented.

## 2022-08-13 NOTE — Op Note (Signed)
PATIENT NAME: Jessica Powell   MEDICAL RECORD NO.:   [id number]    DATE OF BIRTH: [birth year]   DATE OF PROCEDURE: 08/13/2022                               OPERATIVE REPORT     PREOPERATIVE DIAGNOSES: 1. Left-sided cervical radiculopathy. 2. Spinal stenosis spanning C5-C7. 3. S/p previous posterior cervical decompression   POSTOPERATIVE DIAGNOSES: 1. Left-sided cervical radiculopathy. 2. Spinal stenosis spanning C5-C7. 3. S/p previous posterior cervical decompression   PROCEDURE: 1. Anterior cervical decompression and fusion C5/6, C6/7. 2. Placement of anterior instrumentation, C5-C7. 3. Insertion of interbody device x 2 (Titan intervertebral spacers). 4. Intraoperative use of fluoroscopy. 5. Use of morselized allograft - ViviGen.   SURGEON:  Estill Bamberg, MD   ASSISTANT:  Jason Coop, PA-C.   ANESTHESIA:  General endotracheal anesthesia.   COMPLICATIONS:  None.   DISPOSITION:  Stable.   ESTIMATED BLOOD LOSS:  Minimal.   INDICATIONS FOR SURGERY:  Briefly, Jessica Powell is a pleasant 42 y.o. year- old female, who did present to me with severe pain in the neck and left arm.  The patient's MRI did reveal the findings noted above.  Given the patient's ongoing rather debilitating pain and lack of improvement with appropriate treatment measures, including a previous posterior cervical decompression, we did discuss proceeding with the procedure noted above.  The patient was fully aware of the risks and limitations of surgery as outlined in my preoperative note.   OPERATIVE DETAILS:  On 08/13/2022, the patient was brought to surgery and general endotracheal anesthesia was administered.  The patient was placed supine on the hospital bed. The neck was gently extended.  All bony prominences were meticulously padded.  The neck was prepped and draped in the usual sterile fashion.  At this point, I did make a left-sided transverse incision.  The platysma was incised.   A Smith-Robinson approach was used and the anterior spine was identified. A self-retaining retractor was placed.  I then subperiosteally exposed the vertebral bodies from C5-C7.  Caspar pins were then placed into the C6 and C7 vertebral bodies and distraction was applied.  A thorough and complete C6-7 intervertebral diskectomy was performed.  The posterior longitudinal ligament was identified and entered using a nerve hook.  I then used #1 followed by #2 Kerrison to perform a thorough and complete intervertebral diskectomy.  The spinal canal was thoroughly decompressed, as was the left neuroforamen.  The endplates were then prepared and the appropriate-sized intervertebral spacer was then packed with ViviGen and tamped into position in the usual fashion.  The lower Caspar pin was then removed and placed into the C5 vertebral body and once again, distraction was applied across the C5-6 intervertebral space.  I then again performed a thorough and complete diskectomy, thoroughly decompressing the spinal canal and bilateral neuroforamena.  After preparing the endplates, the appropriate-sized intervertebral spacer was packed with ViviGen and tamped into position. The Caspar pins then were removed and bone wax was placed in their place.  The appropriate-sized anterior cervical plate was placed over the anterior spine.  14 mm variable angle screws were placed, 2 in each vertebral body from C5-C7 for a total of 6 vertebral body screws.  The screws were then locked to the plate using the Cam locking mechanism.  I was very pleased with the final fluoroscopic images.  The wound was then irrigated.  The wound was then explored  for any undue bleeding and there was no bleeding noted. The wound was then closed in layers using 2-0 Vicryl, followed by 4-0 Monocryl.  Benzoin and Steri-Strips were applied, followed by sterile dressing.  All instrument counts were correct at the termination of  the procedure.   Of note, Jason Coop, PA-C, was my assistant throughout surgery, and did aid in retraction, suctioning, placement of the hardware, and closure from start to finish.       Estill Bamberg, MD

## 2022-08-13 NOTE — Anesthesia Postprocedure Evaluation (Signed)
Anesthesia Post Note  Patient: Jessica Powell  Procedure(s) Performed: ANTERIOR CERVICAL DECOMPRESSION FUSION CERVICAL Five- CERVICAL Six, CERVICAL Six- CERVICAL Seven WITH INSTRUMENTATION AND ALLOGRAFT     Patient location during evaluation: PACU Anesthesia Type: General Level of consciousness: awake and alert, oriented and patient cooperative Pain management: pain level controlled Vital Signs Assessment: post-procedure vital signs reviewed and stable Respiratory status: spontaneous breathing, nonlabored ventilation and respiratory function stable Cardiovascular status: blood pressure returned to baseline and stable Postop Assessment: no apparent nausea or vomiting Anesthetic complications: no   No notable events documented.  Last Vitals:  Vitals:   08/13/22 1245 08/13/22 1300  BP: 113/65 110/74  Pulse: 92 93  Resp: 20 20  Temp:    SpO2: 93% 93%    Last Pain:  Vitals:   08/13/22 1200  TempSrc:   PainSc: Asleep                 Lannie Fields

## 2022-08-13 NOTE — Anesthesia Procedure Notes (Signed)
Procedure Name: Intubation Date/Time: 08/13/2022 8:54 AM  Performed by: Maxine Glenn, CRNAPre-anesthesia Checklist: Patient identified, Emergency Drugs available, Suction available and Patient being monitored Patient Re-evaluated:Patient Re-evaluated prior to induction Oxygen Delivery Method: Circle System Utilized Preoxygenation: Pre-oxygenation with 100% oxygen Induction Type: IV induction Ventilation: Mask ventilation without difficulty Laryngoscope Size: Glidescope and 3 Grade View: Grade I Tube type: Oral Tube size: 7.0 mm Number of attempts: 1 Airway Equipment and Method: Video-laryngoscopy and Rigid stylet Placement Confirmation: ETT inserted through vocal cords under direct vision, positive ETCO2 and breath sounds checked- equal and bilateral Secured at: 22 cm Tube secured with: Tape Dental Injury: Teeth and Oropharynx as per pre-operative assessment

## 2022-08-14 MED FILL — Thrombin For Soln Kit 20000 Unit: CUTANEOUS | Qty: 1 | Status: AC

## 2022-08-18 ENCOUNTER — Encounter (HOSPITAL_COMMUNITY): Payer: Self-pay | Admitting: Orthopedic Surgery

## 2023-04-09 IMAGING — RF DG CERVICAL SPINE 1V
1 series · 1 of 1 positions shown · non-contrast
Comparison: 12/24/2001 [DATE]

CLINICAL DATA: C6-7 foraminotomy

EXAM:
DG CERVICAL SPINE - 1 VIEW

[Series 1: run · 1 of 1 slices shown]
[im 1/1]
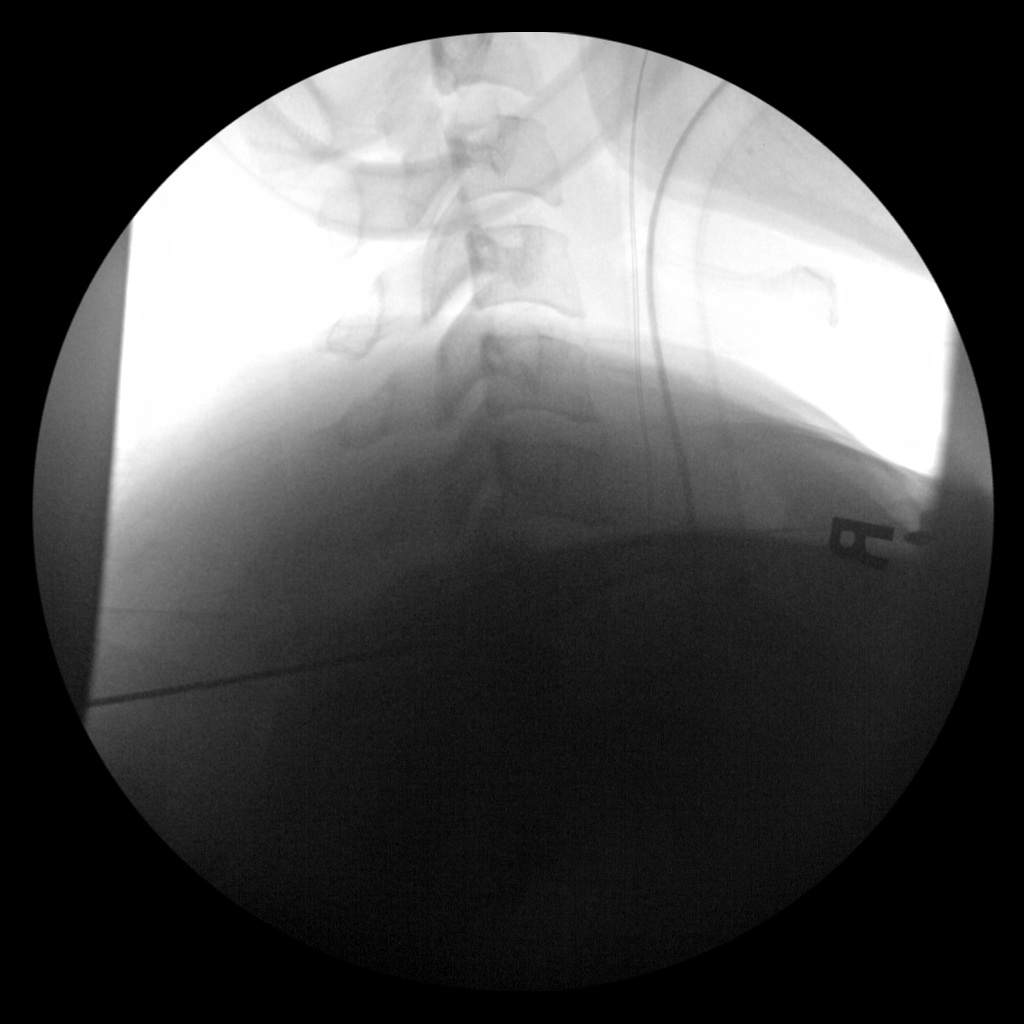

[1 of 1 positions shown; findings below may reference images not displayed]

FINDINGS: Single lateral C-arm images obtained of the cervical spine. Lower
cervical spine not well identified due to overlying shoulders. There
is a posterior needle present extending to the region the lamina.
This is at approximately C7-T1.
IMPRESSION: Posterior needle localization approximally C7-T1.
